# Patient Record
Sex: Female | Born: 1965 | Race: White | Hispanic: No | Marital: Married | Smoking: Never smoker
Health system: Southern US, Community
[De-identification: ages and names within clinical notes are randomized; demographics above are authoritative.]

## PROBLEM LIST (undated history)

## (undated) DIAGNOSIS — IMO0002 Reserved for concepts with insufficient information to code with codable children: Secondary | ICD-10-CM

## (undated) DIAGNOSIS — L309 Dermatitis, unspecified: Secondary | ICD-10-CM

## (undated) DIAGNOSIS — Z87898 Personal history of other specified conditions: Secondary | ICD-10-CM

## (undated) DIAGNOSIS — T8859XA Other complications of anesthesia, initial encounter: Secondary | ICD-10-CM

## (undated) DIAGNOSIS — Z8742 Personal history of other diseases of the female genital tract: Secondary | ICD-10-CM

## (undated) DIAGNOSIS — A630 Anogenital (venereal) warts: Secondary | ICD-10-CM

## (undated) DIAGNOSIS — O09529 Supervision of elderly multigravida, unspecified trimester: Secondary | ICD-10-CM

## (undated) DIAGNOSIS — B373 Candidiasis of vulva and vagina: Secondary | ICD-10-CM

## (undated) DIAGNOSIS — B019 Varicella without complication: Secondary | ICD-10-CM

## (undated) DIAGNOSIS — N939 Abnormal uterine and vaginal bleeding, unspecified: Secondary | ICD-10-CM

## (undated) DIAGNOSIS — R87619 Unspecified abnormal cytological findings in specimens from cervix uteri: Secondary | ICD-10-CM

## (undated) DIAGNOSIS — B379 Candidiasis, unspecified: Secondary | ICD-10-CM

## (undated) DIAGNOSIS — N840 Polyp of corpus uteri: Secondary | ICD-10-CM

## (undated) DIAGNOSIS — T4145XA Adverse effect of unspecified anesthetic, initial encounter: Secondary | ICD-10-CM

## (undated) DIAGNOSIS — N938 Other specified abnormal uterine and vaginal bleeding: Secondary | ICD-10-CM

## (undated) DIAGNOSIS — K469 Unspecified abdominal hernia without obstruction or gangrene: Secondary | ICD-10-CM

## (undated) DIAGNOSIS — C801 Malignant (primary) neoplasm, unspecified: Secondary | ICD-10-CM

## (undated) DIAGNOSIS — Z22322 Carrier or suspected carrier of Methicillin resistant Staphylococcus aureus: Secondary | ICD-10-CM

## (undated) HISTORY — DX: Reserved for concepts with insufficient information to code with codable children: IMO0002

## (undated) HISTORY — DX: Polyp of corpus uteri: N84.0

## (undated) HISTORY — DX: Supervision of elderly multigravida, unspecified trimester: O09.529

## (undated) HISTORY — DX: Personal history of other specified conditions: Z87.898

## (undated) HISTORY — DX: Other complications of anesthesia, initial encounter: T88.59XA

## (undated) HISTORY — DX: Unspecified abnormal cytological findings in specimens from cervix uteri: R87.619

## (undated) HISTORY — DX: Dermatitis, unspecified: L30.9

## (undated) HISTORY — DX: Carrier or suspected carrier of methicillin resistant Staphylococcus aureus: Z22.322

## (undated) HISTORY — DX: Personal history of other diseases of the female genital tract: Z87.42

## (undated) HISTORY — PX: BREAST CYST ASPIRATION: SHX578

## (undated) HISTORY — DX: Other specified abnormal uterine and vaginal bleeding: N93.8

## (undated) HISTORY — DX: Adverse effect of unspecified anesthetic, initial encounter: T41.45XA

## (undated) HISTORY — PX: BREAST BIOPSY: SHX20

## (undated) HISTORY — DX: Malignant (primary) neoplasm, unspecified: C80.1

## (undated) HISTORY — DX: Anogenital (venereal) warts: A63.0

## (undated) HISTORY — DX: Varicella without complication: B01.9

## (undated) HISTORY — DX: Abnormal uterine and vaginal bleeding, unspecified: N93.9

## (undated) HISTORY — DX: Candidiasis, unspecified: B37.9

## (undated) HISTORY — DX: Candidiasis of vulva and vagina: B37.3

## (undated) HISTORY — DX: Unspecified abdominal hernia without obstruction or gangrene: K46.9

## (undated) HISTORY — PX: OTHER SURGICAL HISTORY: SHX169

---

## 1986-11-25 HISTORY — PX: WISDOM TOOTH EXTRACTION: SHX21

## 1994-03-26 HISTORY — PX: BREAST EXCISIONAL BIOPSY: SUR124

## 1994-03-26 HISTORY — PX: BREAST SURGERY: SHX581

## 1998-01-27 ENCOUNTER — Other Ambulatory Visit: Admission: RE | Admit: 1998-01-27 | Discharge: 1998-01-27 | Payer: Self-pay | Admitting: *Deleted

## 1998-03-26 HISTORY — PX: OTHER SURGICAL HISTORY: SHX169

## 1999-02-09 ENCOUNTER — Other Ambulatory Visit: Admission: RE | Admit: 1999-02-09 | Discharge: 1999-02-09 | Payer: Self-pay | Admitting: *Deleted

## 2000-01-16 ENCOUNTER — Other Ambulatory Visit: Admission: RE | Admit: 2000-01-16 | Discharge: 2000-01-16 | Payer: Self-pay | Admitting: Obstetrics and Gynecology

## 2000-05-15 ENCOUNTER — Inpatient Hospital Stay (HOSPITAL_COMMUNITY): Admission: AD | Admit: 2000-05-15 | Discharge: 2000-05-15 | Payer: Self-pay | Admitting: Obstetrics and Gynecology

## 2000-09-08 ENCOUNTER — Inpatient Hospital Stay (HOSPITAL_COMMUNITY): Admission: AD | Admit: 2000-09-08 | Discharge: 2000-09-08 | Payer: Self-pay | Admitting: Obstetrics and Gynecology

## 2000-09-11 ENCOUNTER — Inpatient Hospital Stay (HOSPITAL_COMMUNITY): Admission: AD | Admit: 2000-09-11 | Discharge: 2000-09-15 | Payer: Self-pay | Admitting: Obstetrics and Gynecology

## 2000-09-16 ENCOUNTER — Encounter: Admission: RE | Admit: 2000-09-16 | Discharge: 2000-10-16 | Payer: Self-pay | Admitting: Obstetrics and Gynecology

## 2000-10-21 ENCOUNTER — Other Ambulatory Visit: Admission: RE | Admit: 2000-10-21 | Discharge: 2000-10-21 | Payer: Self-pay | Admitting: Obstetrics and Gynecology

## 2001-03-26 DIAGNOSIS — B373 Candidiasis of vulva and vagina: Secondary | ICD-10-CM

## 2001-03-26 DIAGNOSIS — B3731 Acute candidiasis of vulva and vagina: Secondary | ICD-10-CM

## 2001-03-26 HISTORY — DX: Acute candidiasis of vulva and vagina: B37.31

## 2001-03-26 HISTORY — DX: Candidiasis of vulva and vagina: B37.3

## 2001-10-23 ENCOUNTER — Other Ambulatory Visit: Admission: RE | Admit: 2001-10-23 | Discharge: 2001-10-23 | Payer: Self-pay | Admitting: Obstetrics and Gynecology

## 2002-02-14 ENCOUNTER — Encounter: Payer: Self-pay | Admitting: Obstetrics and Gynecology

## 2002-02-14 ENCOUNTER — Inpatient Hospital Stay (HOSPITAL_COMMUNITY): Admission: AD | Admit: 2002-02-14 | Discharge: 2002-02-14 | Payer: Self-pay | Admitting: Obstetrics and Gynecology

## 2002-03-03 ENCOUNTER — Other Ambulatory Visit: Admission: RE | Admit: 2002-03-03 | Discharge: 2002-03-03 | Payer: Self-pay | Admitting: Obstetrics and Gynecology

## 2002-03-03 ENCOUNTER — Other Ambulatory Visit: Admission: RE | Admit: 2002-03-03 | Discharge: 2002-03-03 | Payer: Self-pay | Admitting: Obstetrics & Gynecology

## 2002-03-26 DIAGNOSIS — Z87898 Personal history of other specified conditions: Secondary | ICD-10-CM

## 2002-03-26 HISTORY — DX: Personal history of other specified conditions: Z87.898

## 2002-05-12 ENCOUNTER — Ambulatory Visit (HOSPITAL_COMMUNITY): Admission: RE | Admit: 2002-05-12 | Discharge: 2002-05-12 | Payer: Self-pay | Admitting: Obstetrics and Gynecology

## 2002-05-12 ENCOUNTER — Encounter: Payer: Self-pay | Admitting: Obstetrics and Gynecology

## 2002-09-11 ENCOUNTER — Encounter: Payer: Self-pay | Admitting: Obstetrics and Gynecology

## 2002-09-11 ENCOUNTER — Ambulatory Visit (HOSPITAL_COMMUNITY): Admission: RE | Admit: 2002-09-11 | Discharge: 2002-09-11 | Payer: Self-pay | Admitting: Obstetrics and Gynecology

## 2002-10-07 ENCOUNTER — Encounter: Payer: Self-pay | Admitting: Obstetrics and Gynecology

## 2002-10-07 ENCOUNTER — Ambulatory Visit (HOSPITAL_COMMUNITY): Admission: RE | Admit: 2002-10-07 | Discharge: 2002-10-07 | Payer: Self-pay | Admitting: Obstetrics and Gynecology

## 2002-10-08 ENCOUNTER — Inpatient Hospital Stay (HOSPITAL_COMMUNITY): Admission: AD | Admit: 2002-10-08 | Discharge: 2002-10-10 | Payer: Self-pay | Admitting: Obstetrics and Gynecology

## 2003-03-03 ENCOUNTER — Other Ambulatory Visit: Admission: RE | Admit: 2003-03-03 | Discharge: 2003-03-03 | Payer: Self-pay | Admitting: Obstetrics and Gynecology

## 2003-03-27 HISTORY — PX: HERNIA REPAIR: SHX51

## 2003-04-16 ENCOUNTER — Ambulatory Visit (HOSPITAL_COMMUNITY): Admission: RE | Admit: 2003-04-16 | Discharge: 2003-04-16 | Payer: Self-pay | Admitting: General Surgery

## 2004-03-06 ENCOUNTER — Other Ambulatory Visit: Admission: RE | Admit: 2004-03-06 | Discharge: 2004-03-06 | Payer: Self-pay | Admitting: Obstetrics and Gynecology

## 2004-12-12 ENCOUNTER — Encounter: Admission: RE | Admit: 2004-12-12 | Discharge: 2004-12-12 | Payer: Self-pay | Admitting: General Surgery

## 2005-04-18 ENCOUNTER — Other Ambulatory Visit: Admission: RE | Admit: 2005-04-18 | Discharge: 2005-04-18 | Payer: Self-pay | Admitting: Obstetrics and Gynecology

## 2007-03-06 ENCOUNTER — Encounter: Admission: RE | Admit: 2007-03-06 | Discharge: 2007-03-06 | Payer: Self-pay | Admitting: Family Medicine

## 2007-03-27 HISTORY — PX: CERVICAL FUSION: SHX112

## 2007-03-28 ENCOUNTER — Ambulatory Visit (HOSPITAL_COMMUNITY): Admission: RE | Admit: 2007-03-28 | Discharge: 2007-03-29 | Payer: Self-pay | Admitting: Neurosurgery

## 2009-03-26 DIAGNOSIS — Z8742 Personal history of other diseases of the female genital tract: Secondary | ICD-10-CM

## 2009-03-26 DIAGNOSIS — N840 Polyp of corpus uteri: Secondary | ICD-10-CM

## 2009-03-26 DIAGNOSIS — N938 Other specified abnormal uterine and vaginal bleeding: Secondary | ICD-10-CM

## 2009-03-26 HISTORY — DX: Polyp of corpus uteri: N84.0

## 2009-03-26 HISTORY — PX: EYE SURGERY: SHX253

## 2009-03-26 HISTORY — DX: Personal history of other diseases of the female genital tract: Z87.42

## 2009-03-26 HISTORY — PX: DILATION AND CURETTAGE OF UTERUS: SHX78

## 2009-03-26 HISTORY — DX: Other specified abnormal uterine and vaginal bleeding: N93.8

## 2009-10-13 ENCOUNTER — Ambulatory Visit (HOSPITAL_COMMUNITY): Admission: RE | Admit: 2009-10-13 | Discharge: 2009-10-13 | Payer: Self-pay | Admitting: Obstetrics and Gynecology

## 2010-06-10 LAB — CBC
RBC: 4.15 MIL/uL (ref 3.87–5.11)
RDW: 12.4 % (ref 11.5–15.5)

## 2010-07-17 ENCOUNTER — Encounter (HOSPITAL_COMMUNITY): Payer: BC Managed Care – PPO

## 2010-07-17 ENCOUNTER — Other Ambulatory Visit: Payer: Self-pay | Admitting: Surgery

## 2010-07-17 LAB — CBC
Platelets: 342 10*3/uL (ref 150–400)
RBC: 4.62 MIL/uL (ref 3.87–5.11)
RDW: 12.3 % (ref 11.5–15.5)
WBC: 6.3 10*3/uL (ref 4.0–10.5)

## 2010-07-17 LAB — SURGICAL PCR SCREEN
MRSA, PCR: NEGATIVE
Staphylococcus aureus: POSITIVE — AB

## 2010-07-17 LAB — HCG, SERUM, QUALITATIVE: Preg, Serum: NEGATIVE

## 2010-07-20 ENCOUNTER — Ambulatory Visit (HOSPITAL_COMMUNITY)
Admission: RE | Admit: 2010-07-20 | Discharge: 2010-07-21 | Disposition: A | Discharge status: Home / Self Care | Payer: BC Managed Care – PPO | Source: Ambulatory Visit | Attending: Surgery | Admitting: Surgery

## 2010-07-20 DIAGNOSIS — Z01812 Encounter for preprocedural laboratory examination: Secondary | ICD-10-CM | POA: Insufficient documentation

## 2010-07-20 DIAGNOSIS — Z79899 Other long term (current) drug therapy: Secondary | ICD-10-CM | POA: Insufficient documentation

## 2010-07-20 DIAGNOSIS — K439 Ventral hernia without obstruction or gangrene: Secondary | ICD-10-CM | POA: Insufficient documentation

## 2010-07-20 DIAGNOSIS — K432 Incisional hernia without obstruction or gangrene: Secondary | ICD-10-CM | POA: Insufficient documentation

## 2010-07-20 DIAGNOSIS — K429 Umbilical hernia without obstruction or gangrene: Secondary | ICD-10-CM | POA: Insufficient documentation

## 2010-07-24 NOTE — Op Note (Signed)
[name], [name]                 ACCOUNT NO.:  [id number]  MEDICAL RECORD NO.:  [id number]           PATIENT TYPE:  O  LOCATION:  1527                         FACILITY:  [facility]  PHYSICIAN:  Ardeth Sportsman, MD     DATE OF BIRTH:  [birth year]  DATE OF PROCEDURE:  07/20/2010 DATE OF DISCHARGE:                              OPERATIVE REPORT   PRIMARY CARE PHYSICIAN:  Pam Drown, M.D.  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  POSTOPERATIVE DIAGNOSES: 1. Recurrent ventral hernia most likely spigelian in right lower     quadrant. 2. Small umbilical hernia through stalk.  PROCEDURE PERFORMED: 1. Laparoscopic ventral hernia repair with mesh. 2. Laparoscopic takedown of bladder with re-tacking. 3. Primary umbilical hernia repair.  ANESTHESIA: 1. General anesthesia. 2. Local anesthetic and field blocks.  SPECIMEN:  None.  DRAIN:  None.  ESTIMATED BLOOD LOSS:  Minimal.  COMPLICATION:  None.  INDICATION:  [name] is a 45 year old female who has had prior abdominal surgeries including C-sections.  She had a spigelian hernia that was repaired in open fashion with onlay mesh in 2005.  She has found some bulging and discomfort there.  Initially, there was just some discomfort,  but no hernia, however, now she has developed one later.  Anatomy and physiology of abdominal formation was discussed. Pathophysiology of herniation with its natural history and risks were discussed.  Options were discussed.  Recommendations made for diagnostic laparoscopic with hernia repair through laparoscopic or open technique. Risks, benefits, and alternatives were discussed.  Questions were answered and she agreed to proceed.  OPERATIVE FINDINGS:  She has a 12 x 7 region ventral hernia most likely 2 larger ones, 1 at the spigelian hernia, and 1 pulling up of some of the stitches.  She  did not have any midline hernias.  DESCRIPTION OF THE PROCEDURE:  Informed consent was confirmed.  The patient voided just prior to coming to the operating room.  She underwent general anesthesia without any difficulty.  She had IV antibiotics given. She was positioned supine with arms tucked.  Her abdomen was prepped and draped in sterile fashion.  Surgical timeout confirmed our plan.  I placed a 5 mm port in the left upper quadrant using optical entry technique.  Entry was clean.  I induced carbon dioxide insufflation. Camera inspection revealed no injury.  Under direct visualization, I placed a 5 mm port in the left flank and a 10 mm port through periumbilical incision just through a small umbilical hernia.  On inspection, noted the fascial defects in the right lower quadrant, they were not incarcerated.  There were no significant adhesions to it.  I decided to take down the peritoneum and bladder in a TAPP-like fashion.  I scored the peritoneum in the lower abdomen using primary blunt as well as some focus cautery, freed the peritoneum off bilateral inguinal regions, and freed the bladder down off the pelvis to allow good mesh overlap.  I meshed the defect.  I chose a 20  x 25 cm dual-side mesh (Physiomesh equals Ultralight polypropylene/Monocryl covering) secured to the abdomen using #1 Prolene interrupted stitches x12.  The upper __________ circumference came through transabdominally.  The lower third of the mesh went down to the pelvis between the bladder and the pelvis to make sure that there is plenty of overlap.  This also covered up the right internal ring and a femoral and obturator foramen as well.  There was no evidence of any hernias down there.  I presumed the round ligament doing this.  There did appear a few transfascial stitches between the pubic brim and the fascia inferiorly so as to make sure I had circumferential transfascial stitches.  I brought the peritoneum  backup and used the laparoscopic absorbable tacker to help re-tack the peritoneum up through the mesh that covered up about half of the mesh up.  I used the tacker until I tacked around the rim as well.  There was small oozer coming off from the left paramedian region, I placed a #1 Prolene transfascial stitch around that including another bite of the mesh and tied that down and provided excellent hemostasis.  I did camera inspection.  There were a few tiny clots, I cleaned out with a Ray-Tec and removed.  Counts were correct.  Gas was evacuated and ports were removed.  The umbilical hernia at the stalk was about 5 mm and I went ahead and closed that using 0 Vicryl interrupted stitches.  Skin was closed __________.  The patient was extubated and sent to recovery room in stable condition. I had discussed postoperative care with the patient in the office. Again prior to surgery, I discussed with her husband as well. Instructions are written.  Hopefully, she will leave later today if she is comfortable, otherwise, she needs to stay there.     Ardeth Sportsman, MD     SCG/MEDQ  D:  07/20/2010  T:  07/21/2010  Job:  [id number]  Electronically Signed by Karie Soda MD on 07/24/2010 01:13:56 PM

## 2010-08-08 NOTE — Op Note (Signed)
[name], [name]                 ACCOUNT NO.:  [id number]   MEDICAL RECORD NO.:  [id number]          PATIENT TYPE:  AMB   LOCATION:  SDS                          FACILITY:  [facility]   PHYSICIAN:  Reinaldo Meeker, M.D. 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   PROCEDURE:  C5-6, C6-7 anterior cervical discectomy with bone bank  fusion followed by anterior cervical plating with a Nuvasive helix  anterior cervical plate.   SURGEON:  Reinaldo Meeker, M.D.   ASSISTANT:  Tia Alert, MD   PROCEDURE IN DETAIL:  After having been placed in the supine position in  5 pounds halter traction, the patient's neck was prepped and draped in  the usual sterile fashion.  Localizing x-ray was taken prior to incision  to identify the appropriate level.  Transverse incision was made in the  right anterior neck starting at the midline and headed towards the  medial aspect of the sternocleidomastoid muscle.  The platysma muscle  was then incised transversely.  The natural fascial plane between the  strap muscles medially and sternocleidomastoid laterally was identified  and followed down to the anterior aspect of cervical spine.  The longus  coli muscles were identified, split in the midline, stripped away  bilaterally with Medical illustrator.  Self-retaining  retractor was placed for exposure and x-ray showed approach to the  appropriate level.   With a 15 blade, the anterior disk at C5-C6 and C6-C7 was incised.  Using pituitary rongeurs and curettes, approximately 90% of the disk  material was removed at both levels.  High-speed drill was used to widen  the interspace at both levels.  The microscope was draped, brought in  the field, used for the end of the case.  Starting at  C5-C6,  microdissection technique was used to remove the remainder of disk  material down the post longitudinal ligament.  The ligament was then  incised transversely and the cut edges removed with a Kerrison punch.  Herniated disk material was then removed as we decompressed and  visualized the C6 nerve root on the left.  Some decompression was  carried out towards the right asymptomatic side.  At this time,  inspection was carried out all in all directions for any evidence of  residual compression and none could be identified.  Large amounts of  irrigation were carried out.  Any bleeding was controlled with Gelfoam.   Attention was then turned to C6-C7.  Once again, the remainder of the  disk material on the posterior longitudinal ligament was removed.  Once  again, the ligament was incised and the cut edge removed.  Once again,  large amounts of herniated disk material were found within the foramen  of C6-C7 on the left.  The C7 nerve root was then decompressed.  At this  time, inspection was carried out at both levels bilaterally  for any  evidence of residual compression.  None could be identified.  Large  amounts of irrigation were carried out.  Measurements were taken and two  6 mm bone bank plugs were reconstituted.  After irrigating once more,  confirming hemostasis, the plugs were impacted without difficulty and  fluoroscopy showed them to be in good position.   A 40 mm helix anterior cervical plate was then chosen.  Under  fluoroscopic guidance, drill holes were placed followed by placing of 13  mm screws times six until the locking mechanism was secured bilaterally  at all three levels.  Final fluoroscopy showed the plate, screws, and  plugs all to be in good position.  Large amounts of irrigation were  carried out.  Any bleeding was controlled with bipolar coagulation.  The  wound was then closed with interrupted Vicryl on the platysma, muscle,  inverted 5-0 PDS in the  subcuticular layer, and Steri-Strips on the  skin.  Sterile dressing and soft collar applied.  The patient was  extubated and taken to the recovery room in stable condition.           ______________________________  Reinaldo Meeker, M.D.     ROK/MEDQ  D:  03/28/2007  T:  03/28/2007  Job:  [id number]

## 2010-08-11 NOTE — Op Note (Signed)
[name], [name]                           ACCOUNT NO.:  [id number]   MEDICAL RECORD NO.:  [id number]                   PATIENT TYPE:  OIB   LOCATION:  NA                                   FACILITY:  [facility]   PHYSICIAN:  Leonie Man, M.D.                DATE OF BIRTH:  03/21/1966   DATE OF PROCEDURE:  04/16/2003  DATE OF DISCHARGE:                                 OPERATIVE REPORT   PREOPERATIVE DIAGNOSIS:  Spigelian hernia right lower quadrant.   POSTOPERATIVE DIAGNOSIS:  Spigelian hernia right lower quadrant.   PROCEDURE:  Repair spigelian hernia with mesh.   SURGEON:  Mardene Celeste. Lurene Shadow, M.D.   ASSISTANT:  Nurse   ANESTHESIA:  General.   NOTE:  The patient is a 45 year old recently postpartum female with a large  bulge at the lateral rectus area in the right lower quadrant.  She has been  having some mild discomfort but no symptoms of bowel obstruction.  She comes  to the operating room now for repair after the risks and potential benefits  of surgery have been discussed, all questions answered, and consent  obtained.   PROCEDURE:  Following the induction of satisfactory general anesthesia with  the patient positioned supine, the abdomen was prepped and draped to be  included in the sterile operative field.  An obliquely placed incision at  the lateral rectus edge over the region of the mass was then carried down  through the skin and subcutaneous tissue and was carried down to the  external oblique aponeurosis which is opened up and carried medially towards  the rectus muscle.  The rectus muscle is retracted medially to find a very  large peritoneal bulge coming through the space.  The peritoneum was grasped  and then entered, a finger was inserted into the peritoneum to palpate the  addition of any other herniae.  There were none  noted.  The peritoneum was  then closed with a purse-string suture somewhat tightening the peritoneum.  I then placed a mesh patch below  the external oblique fascia extending over  the edge of the rectus and over the edge of the internal oblique muscles out  laterally and this was sutured in place with interrupted 2-0 Novafil  sutures.  The external oblique aponeurosis was then closed with a running 2-  0 Novafil suture.  The Scarpa's fascia and subcutaneous tissues were closed  with a running 3-0 Vicryl and the skin was closed with a running 4-0  Monocryl.  Steri-Strips were placed on the wound, the anesthetic reversed,  and the patient removed from the operating room to the recovery room in  stable condition.  She tolerated the procedure well.  Leonie Man, M.D.    PB/MEDQ  D:  04/16/2003  T:  04/16/2003  Job:  [id number]

## 2010-08-11 NOTE — H&P (Signed)
NAMEAMBERLI, [name]                           ACCOUNT NO.:  [id number]   MEDICAL RECORD NO.:  [id number]                   PATIENT TYPE:  OUT   LOCATION:  ULT                                  FACILITY:  [facility]   PHYSICIAN:  Crist Fat. Rivard, M.D.              DATE OF BIRTH:  10/21/1965   DATE OF ADMISSION:  10/07/2002  DATE OF DISCHARGE:                                HISTORY & PHYSICAL   HISTORY OF PRESENT ILLNESS:  This is a 45 year old gravida 3, para 1-0-1-1,  at 39-6/[redacted] weeks gestation who presents for an elective repeat cesarean  section secondary to macrosomia.  Pregnancy has been followed by the nurse  midwife service and remarkable for:  1) AMA.  2) LGA. 3) First trimester  spotting.  4) Previous cesarean section.  5) Desires VBAC.  6) Depression,  anxiety.  7) History of abnormal Pap with laser surgery.   PAST OBSTETRICAL HISTORY:  Remarkable for an elective abortion in 1995 and a  primary low transverse cesarean section in 2002 of a female infant at [redacted]  weeks gestation weighing 9 pounds 12 ounces following and induction of labor  for which the patient developed a fever and failure to progress.   PAST MEDICAL HISTORY:  Remarkable for abnormal Pap smears with laser surgery  in 1991.  History of ovarian cysts.  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  Childhood varicella.  History of anxiety and depression.  Advanced  maternal age.   FAMILY HISTORY:  Remarkable for congestive heart failure in her  grandmothers.  Hypertension in her grandmother and father.  Anemia in her  mother.  Emphysema in her mother.  Hypothyroidism in her cousin.  Breast  cancer in her mother, grandmother, and aunt. Skin cancer in her father.  Anxiety in her mother.  Alcoholism in her grandfather and grandmother.  Genetic history is remarkable for the patient being age 55.   PAST SURGICAL HISTORY:  Remarkable for fibroadenoma removed from the left  breast in 1996.  Laser surgery to her cervix in  1989 and 1992.  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   SOCIAL HISTORY:  The patient is married to Concepcion Elk who is involved and  supportive.  She is of the [employer]. She denies any alcohol, tobacco,  or drug use.   PHYSICAL EXAMINATION:  VITAL SIGNS:  Stable and afebrile.  HEENT:  Within normal limits.  Thyroid normal and not enlarged.  CHEST:  Clear to auscultation.  HEART:  Regular rate and rhythm.  ABDOMEN:  Gravid at 41 cm, vertex to Leopold's.  PELVIC: Cervix is fingertip, 80%, and -1 with a vertex presentation.  Ultrasound today shows polyhydramnios with an AFI of 33.5 and macrosomia  with an estimated fetal weight of 5598 grams.  EXTREMITIES:  Within normal limits.   ASSESSMENT:  1. Intrauterine pregnancy at term.  2. Macrosomia.  3. Polyhydramnios.  4. Previous cesarean section.    PLAN:  1. Admit to operating room.  2. Repeat cesarean section with Dr. Estanislado Pandy.  3. Further orders.     Marie L. Williams, C.N.M.                 Crist Fat Rivard, M.D.    MLW/MEDQ  D:  10/07/2002  T:  10/07/2002  Job:  [id number]

## 2010-08-11 NOTE — Discharge Summary (Signed)
   NAME:  [name], [name]                           ACCOUNT NO.:  [id number]   MEDICAL RECORD NO.:  [id number]                   PATIENT TYPE:  INP   LOCATION:  9122                                 FACILITY:  [facility]   PHYSICIAN:  Crist Fat. Rivard, M.D.              DATE OF BIRTH:  09-Jul-1965   DATE OF ADMISSION:  10/08/2002  DATE OF DISCHARGE:  10/10/2002                                 DISCHARGE SUMMARY   ADMISSION DIAGNOSES:  1. Intrauterine pregnancy at term.  2. Macrosomia.  3. Polyhydramnios.  4. Previous Cesarean section.   DISCHARGE DIAGNOSES:  1. Intrauterine pregnancy at term.  2. Macrosomia.  3. Polyhydramnios.  4. Previous Cesarean section.  5. Postoperative anemia without clinical instability and breast feeding and     plans oral contraceptives for birth control.   PROCEDURE:  Repeat low transverse Cesarean section for delivery of a viable  female infant named [name]. Had Apgar's of 8 and 9 and weighed 11  pounds, 7 ounces on October 08, 2002. Attended and delivered by Dr. Silverio Lay and Wynelle Bourgeois, C.N.M.   HOSPITAL COURSE:  Ms. Mcevers is a 45 year old married white female, gravida  3, para 1, 0/1/1 at term, admitted for repeat Cesarean section secondary to  diagnoses of macrosomia, polyhydramnios and previous Cesarean section. She  underwent the same for delivery of a viable female infant named [name] who weighed 11 pounds, 7 ounces and had Apgar's of 8 and 9 on October 08, 2002. Please see operative note for further details. Her postoperative  course has been uneventful. She is ambulating, voiding, and eating without  difficulty. Her vital signs are stable and she is afebrile. She is breast  feeding without difficulty and desires discharge today.   DISCHARGE INSTRUCTIONS:  Are as per the [facility] and  Gynecologic handout.   DISCHARGE MEDICATIONS:  1. Motrin 600 mg p.o. q.6h p.r.n. for pain.  2. Tylox 1-2 p.o. q.4 to 6 hours  p.r.n. for pain.  3. Prenatal vitamins daily.  4. Ferrous sulfate b.i.d.  5. Colace b.i.d.   DISCHARGE LABORATORY DATA:  Hemoglobin is 8.9, white blood cell count is  7.4, and her platelets are 211,000.    FOLLOW UP:  Discharge followup will be Tuesday or Wednesday at the office  for staple removal and for her 6 week checkup.      Julia Pyo. Horn, C.N.M.              Crist Fat Rivard, M.D.    SJD/MEDQ  D:  10/10/2002  T:  10/11/2002  Job:  [id number]

## 2010-08-11 NOTE — Op Note (Signed)
[facility]  Patient:    [name], [name]                        MRN: [id number] Proc. Date: 09/12/00 Adm. Date:  [id number] Attending:  Silverio Lay A                           Operative Report  PREOPERATIVE DIAGNOSES:       1. Intrauterine pregnancy at 40 weeks 3 days.                               2. Failure to progress.                               3. Suspected macrosomia.  POSTOPERATIVE DIAGNOSES:      1. Intrauterine pregnancy at 40 weeks 3 days.                               2. Failure to progress.                               3. Proven macrosomia.  PROCEDURE:                    Primary low transverse cesarean section.  SURGEON:                      Silverio Lay, M.D.  ASSISTANT:                    Hayes Ludwig, N.P.  ANESTHESIA:                   Epidural.  ESTIMATED BLOOD LOSS:         700 cc.  DESCRIPTION OF PROCEDURE:     After being informed of the planned procedure with possible complications including bleeding, infection, injury to bowels, bladder and ureter, informed consent was obtained. The patient was taken to OR #1 and preexisting epidural was reinforced. A Foley catheter was already in place. The patient was prepped and draped in the sterile fashion. After testing for adequate level of anesthesia, skin, subcutaneous tissue, and fat were incised in a Pfannenstiel fashion. The fascia was incised in a low transverse fashion and linea alba was dissected. The peritoneum was entered in a midline fashion. Visceroperitoneum was entered in the low transverse fashion allowing [place] to develop a bladder flap and to safely retract the bladder. The myometrium was then entered in the low transverse fashion, first with knife and then in a blunt fashion. Amniotic fluid was clear. We assisted the birth of a female infant in ROA presentation at 3:47 p.m. Mouth and nose were suctioned. Cord was clamped with two Kelly clamps and sectioned and  baby was given to the pediatrician present in the room. Twenty cc of blood were drawn from the umbilical vein and the placenta was allowed to spontaneously delivery. Uterine revision was normal. We proceeded with myometrium closure with two layers, first with a running lock suture of 0 Vicryl, then with a Lembert suture of 0 Vicryl covering the first one. Hemostasis was completed in the left angle as well as  midline with three figure-of-eight stitches of 0 Vicryl. Hemostasis was reassessed and adequate. Both tubes and both ovaries were assessed and normal. Both paracolic gutters were cleansed. Hemostasis was reassessed and adequate. Under fascia hemostasis was completed with cautery and fascia was closed with two running sutures of 0 Vicryl and meeting midline. Fat was irrigated with warm saline, wound was infiltrated with 10 cc of Marcaine 0.25%, and skin was closed with staples. Instruments and sponge count is complete x 2. Estimated blood loss 700 cc. Procedure is well tolerated by the patient who is taken to recovery room in a well and stable condition. Baby received an Apgar of 8 at one minute, 9 at five minutes, 9 at 10 minutes and weighed 9 pounds 12 ounces. DD:  09/12/00 TD:  09/12/00 Job: [id number]

## 2010-08-11 NOTE — Discharge Summary (Signed)
[facility]  Patient:    [name], [name]                        MRN: [id number] Adm. Date:  [id number] Disc. Date: [id number] Attending:  Silverio Lay A                           Discharge Summary  REASON FOR ADMISSION:         Intrauterine pregnancy at 40 weeks 2 days, suspected macrosomia, borderline elevation of blood pressure.  HISTORY OF PRESENT ILLNESS:   This is a 45 year old married white female gravida 2, para 0 admitted at 40 weeks and 2 days for elective induction of labor due to suspected macrosomia and borderline elevation of blood pressure.  Her last ultrasound in the office in August 29, 2000 revealed estimated fetal weight at 9 pounds 1 ounce or 97th percentile with normal amniotic fluid index.  At that time elective induction of labor was offered but patient declined.  She was started on Pitocin and at 8 in the morning was 3+ cm, 80% effaced, vertex, -1.  We proceeded with artificial rupture of membranes which revealed clear fluid.  At noon her cervix remained essentially unchanged at 4 cm, 90%, vertex, -1 and an intrauterine pressure catheter was inserted.  At 3 p.m. she was reevaluated with adequate labor for a minimum of three hours with MVU at 260.  Her cervix remained unchanged with now anterior lip edema.  Her temperature slightly elevated to 100.2 despite two doses of penicillin and her blood pressure was 147/97, remaining asymptomatic as far as PIH symptoms.  At that time a long discussion took place regarding labor dystocia with suspected macrosomia and offer was made to follow for a couple more hours and reevaluate but patient declined and requested a cesarean section.  She underwent a primary low transverse cesarean section that day on September 12, 2000 with the birth of a female infant at 3:47 p.m.  Apgars 8, 9, and 9 weighing 9 pounds 12 ounces without complications.  Her postoperative course was uneventful and her blood  pressure rapidly went back to normal.  She received her discharge home on September 15, 2000, or postoperative day #3, in a well and stable condition with a postoperative hemoglobin of 10.1.  She was instructed to come back to the office in four to six weeks for her postoperative postpartum visit.  She was also instructed to call if experiencing increased pain, increased bleeding, or fever and she was given a prescription of Tylox as well as Motrin 600 mg for pain management.  FINAL DIAGNOSES:              Intrauterine pregnancy at 40 weeks and 2 days, macrosomia, failure to progress, status post primary low transverse cesarean section.  CONDITION ON DISCHARGE:       Well and stable. DD:  10/22/00 TD:  10/22/00 Job: [id number]

## 2010-08-11 NOTE — H&P (Signed)
[facility]  Patient:    [name], [name]                          MRN: [id number] Adm. Date:  09/11/00 Attending:  Silverio Lay, M.D.                         History and Physical  REASON FOR ADMISSION:         Intrauterine pregnancy at 40 weeks and two days. Suspected macrosomia.  Borderline elevation of blood pressure.  HISTORY OF PRESENT ILLNESS:   This is a 45 year old married white female, gravida 2, para 0, aborta 1; with a due date of September 09, 2000 by ultrasound. She was last seen in the office on September 11, 2000; reporting good fetal activity, increase in contractions and uterine activity, bloody show and a mucosal discharge; but, denying any watery discharge and denying any symptoms of pregnancy-induced hypertension.  In the office today attempt today was made to insert a Foley catheter in her cervix for cervical ripening, preparing for elective induction of labor.  The cervix is stenotic from the two previous LEAP procedures, and Foley catheter insertion was impossible.  The cervix remained closed, 80% effaced, vertex and minus one.  Her last ultrasound evaluation on August 29, 2000 estimated fetal weight at 9 pounds 1 ounce, 97 percentile; with normal amniotic fluid index and normal Doppler study.  At that time elective induction of labor was offered for suspected macrosomia, but the patient declined.  Prenatal course revealed:  Blood type A positive,  RPR nonreactive, Rubella immune, HBSaG negative, HIV nonreactive, gonorrhea negative, chlamydia negative.  At 11 weeks of pregnancy, reporting urinary frequency and dysuria.  Urine culture showed group B strep.  The patient was treated with amoxicillin 500 mg t.i.d. for seven days.  At 13 weeks of pregnancy an ultrasound revealed a cervical length at 4.5 cm. A 16-week AFP was within normal limits.  A 16-week urine culture testive cure was still positive for group B strep, despite one treatment  with amoxicillin and one treatment with Macrobid.  The patient received another prescription of ampicillin 500 t.i.d. for 10 days.  A 20-week ultrasound revealed normal anatomy survey, anterior placenta, cervical length at 4 cm.  Ultrasound repeated at 25 weeks revealed average for gestational age and cervical length of 3.7 cm.  After four complete courses of antibiotics, the patient was kept on Macrobid 100 mg q.d.  She had a negative urine culture on May 17, 2000.  At 25 weeks urine culture was again positive for group B strep, despite suppressive treatment.  The patient was referred for urology consult.  She was also started on modified bed rest for preterm cervical change.  Consult with Dr. Isabel Caprice revealed a catheterized urine culture negative. Conclusion was cross-contamination from vagina.  He recommended stopping any antibiotics.  A 28-week glucose tolerance test was within normal limits.  A 35-week ultrasound showed estimated fetal weight 95th percentile with normal amniotic fluid.  The prenatal course was otherwise uneventful.  PAST MEDICAL HISTORY:         TAB 1995, no complications.  Two laser procedures on cervix for dysplasia in 1989 and 1992.  Removal of breast lump in 1996, compatible with fibroadenoma.  Positive for HPV.  ALLERGIES:                    SULFA.  FAMILY HISTORY:  Father with chronic hypertension.  Mother, maternal aunt and maternal grandmother, as well as paternal grandmother with breast cancer.  SOCIAL HISTORY:               Married.  Nonsmoker.  Works in Software engineer.  PHYSICAL EXAMINATION:  VITAL SIGNS:                  Current weight 185 pounds, for a total weight gain of 48 pounds.  Blood pressure 124/80.  Urine dipstick showing trace protein.  HEENT:                        Negative.  LUNGS:                        Clear.  HEART:                        Normal.  ABDOMEN:                      Gravid, nontender.   Fundal height at 42 cm. Vertex presentation.  VAGINAL:                      Closed.  80% effaced.  Vertex -1, with strong fibrotic ring, post LEAP.  EXTREMITIES:                  Mild edema.  NST reactive.  ASSESSMENT:                   Intrauterine pregnancy at 40 weeks and two days, status post LEAP with fibrotic cervix.  Suspected macrosomia.  PLAN:                         The patient is admitted to undergo elective induction of labor, with cervical ripening and Pitocin augmentation.  She will receive Cervidil on September 11, 2000.  The patient is well aware of the increased risk of cesarean section due to induction with an unfavorable cervix; but also due to suspected macrosomia.  The patient is also aware we will not recommend an operative delivery.  Consent obtained. DD:  09/11/00 TD:  09/11/00 Job: [id number]

## 2010-08-11 NOTE — Op Note (Signed)
[name], [name]                           ACCOUNT NO.:  [id number]   MEDICAL RECORD NO.:  [id number]                   PATIENT TYPE:  INP   LOCATION:  9199                                 FACILITY:  [facility]   PHYSICIAN:  Crist Fat. Rivard, M.D.              DATE OF BIRTH:  01/06/1966   DATE OF PROCEDURE:  10/08/2002  DATE OF DISCHARGE:                                 OPERATIVE REPORT   PREOPERATIVE DIAGNOSES:  1. Intrauterine pregnancy at 39 weeks 4 days.  2. Macrosomia.  3. Polyhydramnios.  4. Previous cesarean section.   POSTOPERATIVE DIAGNOSES:  1. Intrauterine pregnancy at 39 weeks 4 days.  2. Macrosomia.  3. Polyhydramnios.  4. Previous cesarean section.   ANESTHESIA:  Spinal, Dr. Jean Rosenthal.   PROCEDURE:  Repeat low transverse cesarean section.   SURGEON:  Crist Fat. Rivard, M.D.   ASSISTANT:  Elby Showers. Williams, C.N.M.   ESTIMATED BLOOD LOSS:  500 mL.   PROCEDURE:  After being informed of the planned procedure with possible  complications including bleeding, infection, injury to bowels, bladder,  informed consent was obtained.  The patient was taken to cesarean suite,  given spinal anesthesia without difficulty, and placed in a dorsal decubitus  position, pelvis tilted to the left.  She was prepped and draped in a  sterile fashion.  After assessing for adequate level of anesthesia, some  sensitivity persisted on the left side and local infiltration was performed  using 15 mL of Marcaine 0.25%.  We were then able to proceed with a  Pfannenstiel incision overlooking the previous incision, which was brought  down to the fascia.  Fascia was then incised in a low transverse fashion.  Linea alba was dissected and peritoneum was entered in a midline fashion.  At that point the patient complained of some more sensitivity and the  incision was irrigated with 10 mL of Nesacaine 2%, which provided relief to  the patient.  We were then able to incise the visceral peritoneum in a  low  transverse fashion to safely develop a bladder flap and retract bladder.  Myometrium was then entered in a low transverse fashion, first with a knife,  then bluntly.  Amniotic fluid was clear and abundant, and we collected 2200  mL, confirming the polyhydramnios.  We then assisted the birth of a female  infant in right OA using a vacuum extraction at 9:49 a.m.  Mouth and nose  were suctioned, cord was clamped with two Kelly clamps, and the baby was  given to Dr. Alison Murray, present in the room.   Twenty milliliters of blood was drawn from the umbilical vein, and the  placenta was allowed to deliver spontaneously.  It was complete.  Uterine  revision was negative, and cord had three vessels.  The patient received a  dose of Ancef g at the cord clamping.  We then proceeded with closure of  myometrium  in two layers, first with a running locked suture of 0 Vicryl,  then with a Lembert suture of 0 Vicryl closing the first one.  Hemostasis  was assessed and adequate.  Both paracolic gutters were cleansed, both tubes  and ovaries assessed and normal.  Again at this point the patient complained  of some sensitivity and the incision was irrigated with 20 mL of Nesacaine  1%, which again provided relief to the patient.  Hemostasis was rechecked on  the uterine incision and felt to be adequate.  Under fascia hemostasis was  completed with cautery and the fascia was closed with two running sutures of  0 Vicryl meeting midline.  The wound was then irrigated with warm saline,  hemostasis was completed with cautery, and the skin was closed with staples.  Instrument and sponge count was complete x2.  Estimated blood loss was 500  mL.  The little boy, who was named [name], born at 9:49, received  an Apgar of 8 at one minute and 9 at five minutes and weighed 11 pounds 7  ounces.  The procedure was very well-tolerated by the patient, who was taken  to the recovery room in a well and stable  condition.                                               Crist Fat Rivard, M.D.    SAR/MEDQ  D:  10/08/2002  T:  10/08/2002  Job:  [id number]

## 2010-08-17 ENCOUNTER — Encounter (INDEPENDENT_AMBULATORY_CARE_PROVIDER_SITE_OTHER): Payer: Self-pay | Admitting: Surgery

## 2010-10-09 ENCOUNTER — Ambulatory Visit (INDEPENDENT_AMBULATORY_CARE_PROVIDER_SITE_OTHER): Payer: BC Managed Care – PPO | Admitting: Surgery

## 2010-10-09 ENCOUNTER — Encounter (INDEPENDENT_AMBULATORY_CARE_PROVIDER_SITE_OTHER): Payer: Self-pay | Admitting: Surgery

## 2010-10-09 DIAGNOSIS — K432 Incisional hernia without obstruction or gangrene: Secondary | ICD-10-CM

## 2010-10-09 NOTE — Patient Instructions (Signed)
See "Managing your Pain" Handout

## 2010-10-09 NOTE — Progress Notes (Signed)
Subjective:     Patient ID: [name], female   DOB: 27-May-1965, 45 y.o.   MRN: [id number]  HPI  The. patient comes today with some concerns. She's worried about some suprapubic swelling.  ?Hernia? She notes she still has pain in her right flank/hip.  Off narcotics. Occasionally using Aleve.   No nausea or vomiting. Regular bowel movement. Urinating well. Back to regular activity.  Review of Systems  Constitutional: Negative for fever, chills, diaphoresis, appetite change and fatigue.  HENT: Negative for nosebleeds, sore throat, mouth sores, neck pain and neck stiffness.   Eyes: Negative for photophobia, discharge and visual disturbance.  Respiratory: Negative for cough, choking, chest tightness and shortness of breath.   Cardiovascular: Negative for chest pain and palpitations.  Gastrointestinal: Positive for abdominal pain. Negative for nausea, vomiting, diarrhea, constipation, blood in stool, abdominal distention, anal bleeding and rectal pain.  Genitourinary: Negative for dysuria, frequency, flank pain, vaginal bleeding, vaginal discharge and difficulty urinating.  Musculoskeletal: Negative for back pain, arthralgias and gait problem.  Skin: Negative for color change, pallor and rash.  Neurological: Negative for dizziness, speech difficulty, weakness and numbness.  Hematological: Negative for adenopathy. Does not bruise/bleed easily.  Psychiatric/Behavioral: Negative for confusion and agitation. The patient is not nervous/anxious.        Objective:   Physical Exam  Constitutional: She is oriented to person, place, and time. She appears well-developed and well-nourished. No distress.  HENT:  Head: Normocephalic.  Mouth/Throat: Oropharynx is clear and moist. No oropharyngeal exudate.  Eyes: Conjunctivae and EOM are normal. Pupils are equal, round, and reactive to light. No scleral icterus.  Neck: Normal range of motion. Neck supple. No tracheal deviation present.    Cardiovascular: Normal rate, regular rhythm and intact distal pulses.   Pulmonary/Chest: Effort normal and breath sounds normal. No respiratory distress. She exhibits no tenderness.  Abdominal: Soft. She exhibits no distension and no mass. There is tenderness. There is no rebound and no guarding. Hernia confirmed negative in the right inguinal area and confirmed negative in the left inguinal area.       Small panniculus - no hernias  Genitourinary: No vaginal discharge found.  Musculoskeletal: Normal range of motion. She exhibits no tenderness.  Lymphadenopathy:    She has no cervical adenopathy.       Right: No inguinal adenopathy present.       Left: No inguinal adenopathy present.  Neurological: She is alert and oriented to person, place, and time. No cranial nerve deficit. She exhibits normal muscle tone. Coordination normal.  Skin: Skin is warm and dry. No rash noted. She is not diaphoretic. No erythema.  Psychiatric: She has a normal mood and affect. Her behavior is normal. Judgment and thought content normal.       Assessment:     2-1/2 months status post redo her repair of right lower quadrant/spigelian hernia. No evidence of hernia recurrence.     Plan:     I find no evidence of new hernia. I think she has musculoskeletal strain. I recommended anti-inflammatory (Aleve 2 pilss BID)/ice/heat x3 weeks. Around the clock.  The pain at less than 3 months out his common for this type of redo hernia repair. It should gradually improve with time.  She's working trying to lose weight. Encourage her to continue that. She feels reassured. Return to clinic p.r.n.

## 2010-12-29 LAB — CBC
Hemoglobin: 13.9
MCHC: 34.2
MCV: 91.3
RBC: 4.47
RDW: 12.5
WBC: 7.2

## 2010-12-29 LAB — BASIC METABOLIC PANEL
Creatinine, Ser: 0.42
GFR calc Af Amer: >60
Glucose, Bld: 82

## 2011-07-10 ENCOUNTER — Other Ambulatory Visit: Payer: Self-pay | Admitting: Family Medicine

## 2011-07-10 DIAGNOSIS — M25562 Pain in left knee: Secondary | ICD-10-CM

## 2011-07-13 ENCOUNTER — Ambulatory Visit
Admission: RE | Admit: 2011-07-13 | Discharge: 2011-07-13 | Disposition: A | Discharge status: Home / Self Care | Payer: BC Managed Care – PPO | Source: Ambulatory Visit | Attending: Family Medicine | Admitting: Family Medicine

## 2011-07-13 DIAGNOSIS — M25562 Pain in left knee: Secondary | ICD-10-CM

## 2011-10-01 ENCOUNTER — Ambulatory Visit: Payer: BC Managed Care – PPO | Admitting: Obstetrics and Gynecology

## 2012-02-14 ENCOUNTER — Encounter: Payer: Self-pay | Admitting: Obstetrics and Gynecology

## 2012-02-14 ENCOUNTER — Ambulatory Visit (INDEPENDENT_AMBULATORY_CARE_PROVIDER_SITE_OTHER): Payer: BC Managed Care – PPO | Admitting: Obstetrics and Gynecology

## 2012-02-14 VITALS — BP 110/60 | Ht 64.0 in | Wt 157.0 lb

## 2012-02-14 DIAGNOSIS — Z01419 Encounter for gynecological examination (general) (routine) without abnormal findings: Secondary | ICD-10-CM

## 2012-02-14 DIAGNOSIS — Z124 Encounter for screening for malignant neoplasm of cervix: Secondary | ICD-10-CM

## 2012-02-14 NOTE — Addendum Note (Signed)
Addended by: Darien Ramus on: 02/14/2012 03:40 PM   Modules accepted: Orders

## 2012-02-14 NOTE — Progress Notes (Signed)
The patient reports:no complaints  Contraception:Condoms  Last mammogram:03/14/2011 Normal  Last pap: 10/05/2010 Normal  GC/Chlamydia cultures offered: declined HIV/RPR/HbsAg offered:  declined HSV 1 and 2 glycoprotein offered: declined  Menstrual cycle regular and monthly: Yes Menstrual flow normal: Yes  Urinary symptoms: none Normal bowel movements: Yes Reports abuse at home: No:    Subjective:    [name] is a 46 y.o. female, [id number], who presents for an annual exam.     History   Social History  . Marital Status: Married    Spouse Name: N/A    Number of Children: N/A  . Years of Education: N/A   Social History Main Topics  . Smoking status: Never Smoker   . Smokeless tobacco: Never Used  . Alcohol Use: Yes     Comment: 0-2 GLASSES A WEEK  . Drug Use: No  . Sexually Active: Not Currently    Birth Control/ Protection: Condom, None   Other Topics Concern  . None   Social History Narrative  . None    Menstrual cycle:   LMP: Patient's last menstrual period was 01/25/2012.           Cycle: normal  The following portions of the patient's history were reviewed and updated as appropriate: allergies, current medications, past family history, past medical history, past social history, past surgical history and problem list.  Review of Systems Pertinent items are noted in HPI. Breast:Negative for breast lump,nipple discharge or nipple retraction Gastrointestinal: Negative for abdominal pain, change in bowel habits or rectal bleeding Urinary:negative   Objective:    BP 110/60  Ht 5\' 4"  (1.626 m)  Wt 157 lb (71.215 kg)  BMI 26.95 kg/m2  LMP 01/25/2012    Weight:  Wt Readings from Last 1 Encounters:  02/14/12 157 lb (71.215 kg)          BMI: Body mass index is 26.95 kg/(m^2).  General Appearance: Alert, appropriate appearance for age. No acute distress HEENT: Grossly normal Neck / Thyroid: Supple, no masses, nodes or enlargement Lungs: clear to  auscultation bilaterally Back: No CVA tenderness Breast Exam: No masses or nodes.No dimpling, nipple retraction or discharge. Cardiovascular: Regular rate and rhythm. S1, S2, no murmur Gastrointestinal: Soft, non-tender, no masses or organomegaly Pelvic Exam: Vulva and vagina appear normal. Bimanual exam reveals normal uterus and adnexa. Rectovaginal: normal rectal, no masses Lymphatic Exam: Non-palpable nodes in neck, clavicular, axillary, or inguinal regions Skin: no rash or abnormalities Neurologic: Normal gait and speech, no tremor  Psychiatric: Alert and oriented, appropriate affect.    Assessment:    Normal gyn exam    Plan:    mammogram pap smear return annually or prn STD screening: declined Contraception:no method   Silverio Lay MD

## 2012-02-15 LAB — PAP IG W/ RFLX HPV ASCU

## 2012-03-20 NOTE — Progress Notes (Signed)
Quick Note:  Please send "Dense breast" letter to patient and document in chart when letter is sent. ______ 

## 2012-03-24 ENCOUNTER — Encounter: Payer: Self-pay | Admitting: Obstetrics and Gynecology

## 2013-05-06 ENCOUNTER — Ambulatory Visit (INDEPENDENT_AMBULATORY_CARE_PROVIDER_SITE_OTHER): Payer: BC Managed Care – PPO | Admitting: Internal Medicine

## 2013-05-06 ENCOUNTER — Ambulatory Visit (HOSPITAL_COMMUNITY)
Admission: RE | Admit: 2013-05-06 | Discharge: 2013-05-06 | Disposition: A | Discharge status: Home / Self Care | Payer: BC Managed Care – PPO | Source: Ambulatory Visit | Attending: Cardiovascular Disease | Admitting: Cardiovascular Disease

## 2013-05-06 ENCOUNTER — Encounter: Payer: Self-pay | Admitting: Internal Medicine

## 2013-05-06 VITALS — BP 120/80 | HR 86 | Ht 64.0 in | Wt 156.0 lb

## 2013-05-06 DIAGNOSIS — R079 Chest pain, unspecified: Secondary | ICD-10-CM

## 2013-05-06 DIAGNOSIS — R002 Palpitations: Secondary | ICD-10-CM

## 2013-05-06 DIAGNOSIS — R072 Precordial pain: Secondary | ICD-10-CM

## 2013-05-06 DIAGNOSIS — I079 Rheumatic tricuspid valve disease, unspecified: Secondary | ICD-10-CM | POA: Insufficient documentation

## 2013-05-06 DIAGNOSIS — R011 Cardiac murmur, unspecified: Secondary | ICD-10-CM

## 2013-05-06 NOTE — Progress Notes (Signed)
2D Echo Performed 05/06/2013    Destynee Stringfellow, RCS  

## 2013-05-06 NOTE — Progress Notes (Signed)
OFFICE NOTE  Chief Complaint:  Chest pain, palpitations, murmur  Primary Care Physician: Cari Caraway, MD  HPI:  [name] is a pleasant 48 year old female who is coming for to me by Dr. Cletis Media, her OB/GYN. Recently she was in the office and on exam was noted to have a murmur. It seems after some discussion Ms. Frohlich has been having some problems with chest discomfort, palpitations and an episode of shortness of breath and fatigue. She says that over the past 6-9 months she's been having these symptoms including palpitations that occur sometimes with exertion or other times at rest. With regard to the murmur, she was told that she did have a murmur as a child when she was being evaluated for scarlet fever. Symptoms had improved and her murmur apparently disappeared for some time but was recently heard again. 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 She is a avid runner, about 3 times a week and participates in 5K events. Recently she participated in a 5K and felt markedly fatigued and short of breath toward the end of the run. She says that she thought she might have had a cold prior to that, which could explain some of her symptoms.  However she thinks her palpitations are getting somewhat worse.  Her chest pain described as a heaviness or soreness, sometimes associated with palpitations, lightheadedness or dizziness.  Improve sometimes with rest but at other times randomly.  PMHx:  Past Medical History  Diagnosis Date  . 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 MRSA (methicillin resistant staph aureus) culture positive   . H/O metrorrhagia 10/14/2009  . Endometrial polyp 2011  . Abnormal Pap smear [facility]  . 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 Candidiasis   . AMA (advanced maternal age) multigravida 55+   . Macrosomia   . Cancer   . Varicella   . Complication of anesthesia     Shakes  & get cold according to ptient  . Monilial vaginitis 2003  . H/O mastitis 2004  . Vaginal bleeding, abnormal   . Dermatitis   . H/O metrorrhagia 2011  . DUB (dysfunctional uterine bleeding) 2011    Past Surgical History  Procedure Laterality Date  . Cesarean section    . Hernia repair  2005  . Cervical fusion  2009  . Dilation and curettage of uterus  2011  . Eye surgery  2011    CATARACTS  . Basal cancer cell removal  2000    Face   . Wisdom tooth extraction  11/1986  . Laser surgery on cervical dysplasia  6/89 & 9/92  . Breast surgery  1996    FAMHx:  Family History  Problem Relation Age of Onset  . COPD Mother     Emphysema  . Breast cancer Mother   . Depression Mother   . Alcohol abuse Mother   . Hypertension Father   . Kidney disease Father   . Skin cancer Father   . Hypertension Maternal Grandmother   . Breast cancer Maternal Grandmother   . Alcohol abuse Maternal Grandfather   . Heart disease Paternal Grandmother     Congestive Heart Failure  . Hypertension Paternal Grandmother   . Breast cancer Paternal Grandmother     SOCHx:   reports that she has never smoked. She has never used smokeless tobacco. She reports that she drinks about  1.5 ounces of alcohol per week. She reports that she does not use illicit drugs.  ALLERGIES:  Allergies  Allergen Reactions  . Sulfa Drugs Cross Reactors Nausea Only and Rash    ROS: A comprehensive review of systems was negative except for: Respiratory: positive for dyspnea on exertion Cardiovascular: positive for chest pressure/discomfort, palpitations and murmur  HOME MEDS: Current Outpatient Prescriptions  Medication Sig Dispense Refill  . Multiple Vitamin (MULTIVITAMIN) capsule Take 1 capsule by mouth daily.         No current facility-administered medications for this visit.    LABS/IMAGING: No results found for this or any previous visit (from the past 48 hour(s)). No results found.  VITALS: BP 120/80   Pulse 86  Ht 5\' 4"  (1.626 m)  Wt 156 lb (70.761 kg)  BMI 26.76 kg/m2  EXAM: General appearance: alert and no distress Neck: no carotid bruit and no JVD Lungs: clear to auscultation bilaterally Heart: irregularly irregular rhythm, S1, S2 normal and systolic murmur: early systolic 2/6, blowing at apex Abdomen: soft, non-tender; bowel sounds normal; no masses,  no organomegaly Extremities: extremities normal, atraumatic, no cyanosis or edema Pulses: 2+ and symmetric Skin: Skin color, texture, turgor normal. No rashes or lesions Neurologic: Grossly normal Psych: Mood, affect normal  EKG: Sinus rhythm with occasional PVCs at 86  ASSESSMENT: 1. Palpitations-PVCs noted 2. Murmur-suggestive of mitral regurgitation 3. Chest pressure/dyspnea with exertion  PLAN: 1.   Ms. Acord is having some symptoms of shortness of breath and pressure with exertion, but does not happen routinely with exercise. She is palpitations and there is evidence of PVCs on her EKG today. She was not really aware of them, but does feel like they come on fairly frequently, maybe several times a week and last much longer and cause her more symptoms. Therefore would recommend a one week monitor to evaluate for these applications and the burden of her PVCs. I would also recommend an exercise treadmill stress test. Although she has few cardiac risk factors, she is having new PVCs and has had chest pressure, decreased exercise tolerance and some shortness of breath which could be related to ischemia. Finally I would recommend an echocardiogram to evaluate her murmur, which is soft but could represent mitral regurgitation. I do not appreciate a midsystolic click and suspect mitral valve prolapse is less likely.  Thank you again for the kind referral. I plan to see her back to discuss results of her studies in a few weeks.  Pixie Casino, MD, [facility] Attending Cardiologist [facility]  Marlette Curvin C 05/06/2013, 5:52  PM

## 2013-05-06 NOTE — Patient Instructions (Signed)
Your physician has requested that you have an echocardiogram. Echocardiography is a painless test that uses sound waves to create images of your heart. It provides your doctor with information about the size and shape of your heart and how well your heart's chambers and valves are working. This procedure takes approximately one hour. There are no restrictions for this procedure.  ECHO TODAY.  Your physician has requested that you have an exercise tolerance test. For further information please visit [email or web address].[email or web address]. Please also follow instruction sheet, as given.  Your physician has recommended that you wear an event monitor. Event monitors are medical devices that record the heart's electrical activity. Doctors most often [place] these monitors to diagnose arrhythmias. Arrhythmias are problems with the speed or rhythm of the heartbeat. The monitor is a small, portable device. You can wear one while you do your normal daily activities. This is usually used to diagnose what is causing palpitations/syncope (passing out). You will wear this for 1 week.   Your physician recommends that you schedule a follow-up appointment after your stress test & monitor.

## 2013-05-11 ENCOUNTER — Telehealth (HOSPITAL_COMMUNITY): Payer: Self-pay | Admitting: *Deleted

## 2013-05-13 ENCOUNTER — Telehealth: Payer: Self-pay | Admitting: Internal Medicine

## 2013-05-13 ENCOUNTER — Telehealth: Payer: Self-pay | Admitting: *Deleted

## 2013-05-13 NOTE — Telephone Encounter (Signed)
Returning your call. °

## 2013-05-13 NOTE — Telephone Encounter (Signed)
Patient call returned. Notified her of results.

## 2013-05-26 ENCOUNTER — Ambulatory Visit (HOSPITAL_COMMUNITY)
Admission: RE | Admit: 2013-05-26 | Discharge: 2013-05-26 | Disposition: A | Discharge status: Home / Self Care | Payer: BC Managed Care – PPO | Source: Ambulatory Visit | Attending: Internal Medicine | Admitting: Internal Medicine

## 2013-05-26 DIAGNOSIS — R079 Chest pain, unspecified: Secondary | ICD-10-CM | POA: Insufficient documentation

## 2013-05-26 DIAGNOSIS — R002 Palpitations: Secondary | ICD-10-CM

## 2013-05-28 ENCOUNTER — Telehealth: Payer: Self-pay | Admitting: *Deleted

## 2013-05-28 NOTE — Telephone Encounter (Signed)
Left message on private VM with normal exercise tolerance test results - reminded of OV 3/20

## 2013-06-12 ENCOUNTER — Ambulatory Visit (INDEPENDENT_AMBULATORY_CARE_PROVIDER_SITE_OTHER): Payer: BC Managed Care – PPO | Admitting: Internal Medicine

## 2013-06-12 ENCOUNTER — Encounter: Payer: Self-pay | Admitting: Internal Medicine

## 2013-06-12 VITALS — BP 128/82 | HR 88 | Ht 64.0 in | Wt 160.7 lb

## 2013-06-12 DIAGNOSIS — R002 Palpitations: Secondary | ICD-10-CM

## 2013-06-12 DIAGNOSIS — I493 Ventricular premature depolarization: Secondary | ICD-10-CM

## 2013-06-12 DIAGNOSIS — R079 Chest pain, unspecified: Secondary | ICD-10-CM

## 2013-06-12 DIAGNOSIS — I4949 Other premature depolarization: Secondary | ICD-10-CM

## 2013-06-12 NOTE — Patient Instructions (Signed)
Your physician recommends that you schedule a follow-up appointment as needed  

## 2013-06-12 NOTE — Progress Notes (Signed)
OFFICE NOTE  Chief Complaint:  Chest pain, palpitations, murmur  Primary Care Physician: Cari Caraway, MD  HPI:  [name] is a pleasant 48 year old female who is coming for to me by Dr. Cletis Media, her OB/GYN. Recently she was in the office and on exam was noted to have a murmur. It seems after some discussion Ms. Horn has been having some problems with chest discomfort, palpitations and an episode of shortness of breath and fatigue. She says that over the past 6-9 months she's been having these symptoms including palpitations that occur sometimes with exertion or other times at rest. With regard to the murmur, she was told that she did have a murmur as a child when she was being evaluated for scarlet fever. Symptoms had improved and her murmur apparently disappeared for some time but was recently heard again. 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 She is a avid runner, about 3 times a week and participates in 5K events. Recently she participated in a 5K and felt markedly fatigued and short of breath toward the end of the run. She says that she thought she might have had a cold prior to that, which could explain some of her symptoms.  However she thinks her palpitations are getting somewhat worse.  Her chest pain described as a heaviness or soreness, sometimes associated with palpitations, lightheadedness or dizziness.  Improve sometimes with rest but at other times randomly.  [name] returns today for followup of her tests. She underwent an echocardiogram which showed essentially normal structural heart. There were no valvular abnormalities and no significant regurgitation. She had normal systolic and diastolic function.  She also underwent an exercise treadmill stress test and exercised for 11 minutes, 58.5 metabolic equivalents. This was negative for ischemia.  Finally, her monitor demonstrated isolated PVCs but no evidence  for nonsustained VT, atrial fibrillation or other arrhythmias.  PMHx:  Past Medical History  Diagnosis Date  . 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 MRSA (methicillin resistant staph aureus) culture positive   . H/O metrorrhagia 10/14/2009  . Endometrial polyp 2011  . Abnormal Pap smear [place]  . 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 Candidiasis   . AMA (advanced maternal age) multigravida 60+   . Macrosomia   . Cancer   . Varicella   . Complication of anesthesia     Shakes & get cold according to ptient  . Monilial vaginitis 2003  . H/O mastitis 2004  . Vaginal bleeding, abnormal   . Dermatitis   . H/O metrorrhagia 2011  . DUB (dysfunctional uterine bleeding) 2011    Past Surgical History  Procedure Laterality Date  . Cesarean section    . Hernia repair  2005  . Cervical fusion  2009  . Dilation and curettage of uterus  2011  . Eye surgery  2011    CATARACTS  . Basal cancer cell removal  2000    Face   . Wisdom tooth extraction  11/1986  . Laser surgery on cervical dysplasia  6/89 & 9/92  . Breast surgery  1996    FAMHx:  Family History  Problem Relation Age of Onset  . COPD Mother     Emphysema  . Breast cancer Mother   . Depression Mother   . Alcohol abuse Mother   . Hypertension Father   . Kidney disease Father   .  Skin cancer Father   . Hypertension Maternal Grandmother   . Breast cancer Maternal Grandmother   . Alcohol abuse Maternal Grandfather   . Heart disease Paternal Grandmother     Congestive Heart Failure  . Hypertension Paternal Grandmother   . Breast cancer Paternal Grandmother     SOCHx:   reports that she has never smoked. She has never used smokeless tobacco. She reports that she drinks about 1.5 ounces of alcohol per week. She reports that she does not use illicit drugs.  ALLERGIES:  Allergies  Allergen Reactions  . Sulfa Drugs Cross Reactors Nausea Only and Rash    ROS: A comprehensive review  of systems was negative except for: Respiratory: positive for dyspnea on exertion Cardiovascular: positive for chest pressure/discomfort, palpitations and murmur  HOME MEDS: Current Outpatient Prescriptions  Medication Sig Dispense Refill  . Multiple Vitamin (MULTIVITAMIN) capsule Take 1 capsule by mouth daily.         No current facility-administered medications for this visit.    LABS/IMAGING: No results found for this or any previous visit (from the past 48 hour(s)). No results found.  VITALS: BP 128/82  Pulse 88  Ht 5\' 4"  (1.626 m)  Wt 160 lb 11.2 oz (72.893 kg)  BMI 27.57 kg/m2  EXAM: General appearance: alert and no distress Neck: no carotid bruit and no JVD Lungs: clear to auscultation bilaterally Heart: irregularly irregular rhythm, S1, S2 normal and systolic murmur: early systolic 2/6, blowing at apex Abdomen: soft, non-tender; bowel sounds normal; no masses,  no organomegaly Extremities: extremities normal, atraumatic, no cyanosis or edema Pulses: 2+ and symmetric Skin: Skin color, texture, turgor normal. No rashes or lesions Neurologic: Grossly normal Psych: Mood, affect normal  EKG: Sinus rhythm with occasional PVCs at 86  ASSESSMENT: 1. Palpitations-PVCs noted 2. Murmur-no significant valvular disease 3. Chest pressure/dyspnea with exertion - negative Bruce treadmill stress test  PLAN: 1.   Ms. Horn had a negative stress test and a normal echo with normal diastolic function. No valvular abnormalities were noted. She is having PVCs, which I suspect could be isolated or perhaps RV outflow tract PVCs. They may be suppressed with a beta blocker, but she is not interested in medication at this time. She will continue to work on stress reduction, lifestyle modification and avoidance of stimulants. I'm happy to see her back on an as-needed basis.  Thank you again for the kind referral.  Pixie Casino, MD, [facility] Attending Cardiologist [facility]  Koji Niehoff C 06/12/2013, 5:00 PM

## 2013-07-27 NOTE — Telephone Encounter (Signed)
Encounter Closed---07/27/13 TP 

## 2014-01-25 ENCOUNTER — Encounter: Payer: Self-pay | Admitting: Internal Medicine

## 2014-06-04 ENCOUNTER — Other Ambulatory Visit: Payer: Self-pay | Admitting: Radiology

## 2014-06-04 DIAGNOSIS — R922 Inconclusive mammogram: Secondary | ICD-10-CM

## 2014-06-04 DIAGNOSIS — Z803 Family history of malignant neoplasm of breast: Secondary | ICD-10-CM

## 2014-06-29 ENCOUNTER — Ambulatory Visit
Admission: RE | Admit: 2014-06-29 | Discharge: 2014-06-29 | Disposition: A | Discharge status: Home / Self Care | Payer: BLUE CROSS/BLUE SHIELD | Source: Ambulatory Visit | Attending: Radiology | Admitting: Radiology

## 2014-06-29 DIAGNOSIS — Z803 Family history of malignant neoplasm of breast: Secondary | ICD-10-CM

## 2014-06-29 DIAGNOSIS — R9230 Dense breasts, unspecified: Secondary | ICD-10-CM

## 2014-06-29 DIAGNOSIS — R922 Inconclusive mammogram: Secondary | ICD-10-CM

## 2014-06-29 MED ORDER — GADOBENATE DIMEGLUMINE 529 MG/ML IV SOLN
14.0000 mL | Freq: Once | INTRAVENOUS | Status: AC | PRN
  Administered 2014-06-29: 14 mL via INTRAVENOUS

## 2014-07-05 ENCOUNTER — Other Ambulatory Visit: Payer: Self-pay | Admitting: Radiology

## 2014-07-05 DIAGNOSIS — R928 Other abnormal and inconclusive findings on diagnostic imaging of breast: Secondary | ICD-10-CM

## 2014-07-13 ENCOUNTER — Ambulatory Visit
Admission: RE | Admit: 2014-07-13 | Discharge: 2014-07-13 | Disposition: A | Discharge status: Home / Self Care | Payer: BLUE CROSS/BLUE SHIELD | Source: Ambulatory Visit | Attending: Radiology | Admitting: Radiology

## 2014-07-13 DIAGNOSIS — R928 Other abnormal and inconclusive findings on diagnostic imaging of breast: Secondary | ICD-10-CM

## 2014-07-13 MED ORDER — GADOBENATE DIMEGLUMINE 529 MG/ML IV SOLN: 15.0000 mL | Freq: Once | INTRAVENOUS | Status: AC | PRN

## 2014-12-10 ENCOUNTER — Other Ambulatory Visit: Payer: Self-pay | Admitting: Obstetrics and Gynecology

## 2014-12-10 DIAGNOSIS — R922 Inconclusive mammogram: Secondary | ICD-10-CM

## 2014-12-10 DIAGNOSIS — Z803 Family history of malignant neoplasm of breast: Secondary | ICD-10-CM

## 2014-12-23 ENCOUNTER — Other Ambulatory Visit: Payer: BLUE CROSS/BLUE SHIELD

## 2015-01-04 ENCOUNTER — Other Ambulatory Visit: Payer: Self-pay | Admitting: Obstetrics and Gynecology

## 2015-01-04 DIAGNOSIS — Z803 Family history of malignant neoplasm of breast: Secondary | ICD-10-CM

## 2015-01-04 DIAGNOSIS — N6489 Other specified disorders of breast: Secondary | ICD-10-CM

## 2015-01-04 DIAGNOSIS — R922 Inconclusive mammogram: Secondary | ICD-10-CM

## 2015-01-04 DIAGNOSIS — N6019 Diffuse cystic mastopathy of unspecified breast: Secondary | ICD-10-CM

## 2015-01-04 DIAGNOSIS — N6099 Unspecified benign mammary dysplasia of unspecified breast: Secondary | ICD-10-CM

## 2015-01-05 ENCOUNTER — Inpatient Hospital Stay: Admission: RE | Admit: 2015-01-05 | Payer: BLUE CROSS/BLUE SHIELD | Source: Ambulatory Visit

## 2015-05-10 ENCOUNTER — Other Ambulatory Visit: Payer: Self-pay

## 2015-05-10 DIAGNOSIS — Z1231 Encounter for screening mammogram for malignant neoplasm of breast: Secondary | ICD-10-CM

## 2015-06-01 ENCOUNTER — Ambulatory Visit: Payer: BLUE CROSS/BLUE SHIELD

## 2015-06-17 ENCOUNTER — Ambulatory Visit
Admission: RE | Admit: 2015-06-17 | Discharge: 2015-06-17 | Disposition: A | Discharge status: Home / Self Care | Payer: BC Managed Care – PPO | Source: Ambulatory Visit

## 2015-06-17 DIAGNOSIS — Z1231 Encounter for screening mammogram for malignant neoplasm of breast: Secondary | ICD-10-CM

## 2015-06-28 ENCOUNTER — Other Ambulatory Visit: Payer: Self-pay | Admitting: Obstetrics and Gynecology

## 2015-06-28 DIAGNOSIS — R928 Other abnormal and inconclusive findings on diagnostic imaging of breast: Secondary | ICD-10-CM

## 2015-07-11 ENCOUNTER — Ambulatory Visit
Admission: RE | Admit: 2015-07-11 | Discharge: 2015-07-11 | Disposition: A | Discharge status: Home / Self Care | Payer: BC Managed Care – PPO | Source: Ambulatory Visit | Attending: Obstetrics and Gynecology | Admitting: Obstetrics and Gynecology

## 2015-07-11 ENCOUNTER — Other Ambulatory Visit: Payer: Self-pay | Admitting: Obstetrics and Gynecology

## 2015-07-11 DIAGNOSIS — R928 Other abnormal and inconclusive findings on diagnostic imaging of breast: Secondary | ICD-10-CM

## 2015-07-19 ENCOUNTER — Ambulatory Visit
Admission: RE | Admit: 2015-07-19 | Discharge: 2015-07-19 | Disposition: A | Discharge status: Home / Self Care | Payer: BC Managed Care – PPO | Source: Ambulatory Visit | Attending: Obstetrics and Gynecology | Admitting: Obstetrics and Gynecology

## 2015-07-19 ENCOUNTER — Other Ambulatory Visit: Payer: Self-pay | Admitting: Obstetrics and Gynecology

## 2015-07-19 DIAGNOSIS — R928 Other abnormal and inconclusive findings on diagnostic imaging of breast: Secondary | ICD-10-CM

## 2016-03-16 ENCOUNTER — Other Ambulatory Visit: Payer: Self-pay | Admitting: Obstetrics and Gynecology

## 2016-03-16 DIAGNOSIS — N6320 Unspecified lump in the left breast, unspecified quadrant: Secondary | ICD-10-CM

## 2016-03-21 ENCOUNTER — Ambulatory Visit
Admission: RE | Admit: 2016-03-21 | Discharge: 2016-03-21 | Disposition: A | Discharge status: Home / Self Care | Payer: BC Managed Care – PPO | Source: Ambulatory Visit | Attending: Obstetrics and Gynecology | Admitting: Obstetrics and Gynecology

## 2016-03-21 ENCOUNTER — Other Ambulatory Visit: Payer: Self-pay | Admitting: Obstetrics and Gynecology

## 2016-03-21 DIAGNOSIS — N6320 Unspecified lump in the left breast, unspecified quadrant: Secondary | ICD-10-CM

## 2016-05-08 ENCOUNTER — Other Ambulatory Visit: Payer: Self-pay | Admitting: Obstetrics and Gynecology

## 2016-05-08 DIAGNOSIS — Z803 Family history of malignant neoplasm of breast: Secondary | ICD-10-CM

## 2016-08-09 ENCOUNTER — Ambulatory Visit
Admission: RE | Admit: 2016-08-09 | Discharge: 2016-08-09 | Disposition: A | Discharge status: Home / Self Care | Payer: BC Managed Care – PPO | Source: Ambulatory Visit | Attending: Obstetrics and Gynecology | Admitting: Obstetrics and Gynecology

## 2016-08-09 DIAGNOSIS — Z803 Family history of malignant neoplasm of breast: Secondary | ICD-10-CM

## 2016-08-09 MED ORDER — GADOBENATE DIMEGLUMINE 529 MG/ML IV SOLN
14.0000 mL | Freq: Once | INTRAVENOUS | Status: AC | PRN
  Administered 2016-08-09: 14 mL via INTRAVENOUS

## 2017-04-16 IMAGING — US US BREAST*R* LIMITED INC AXILLA
1 series · 2 of 2 positions shown · non-contrast
Comparison: 06/17/2015 and earlier priors.

CLINICAL DATA: 49-year-old patient recalled from recent screening
3D mammogram for possible masses in the left breast and possible
distortion in the right breast. The patient has a strong family
history of breast cancer, including her sister at age 38, mother at
age 52, maternal aunt at age 48, and maternal grandmother at age 62.
The patient underwent a breast MRI in June 2014 and a subsequent
MRI guided right breast biopsy, with benign findings. A six-month
follow-up breast MRI was recommended, but the patient has been
unable to obtain a follow-up breast MRI due to lack of approval from
her medical insurance.

EXAM:
2D DIGITAL DIAGNOSTIC BILATERAL MAMMOGRAM without CAD AND ADJUNCT
TOMO
ULTRASOUND BILATERAL BREAST

[Series 1: us breast*right* limited inc axilla · 0.07mm/px · 2 of 2 slices shown]
[im 1/2]
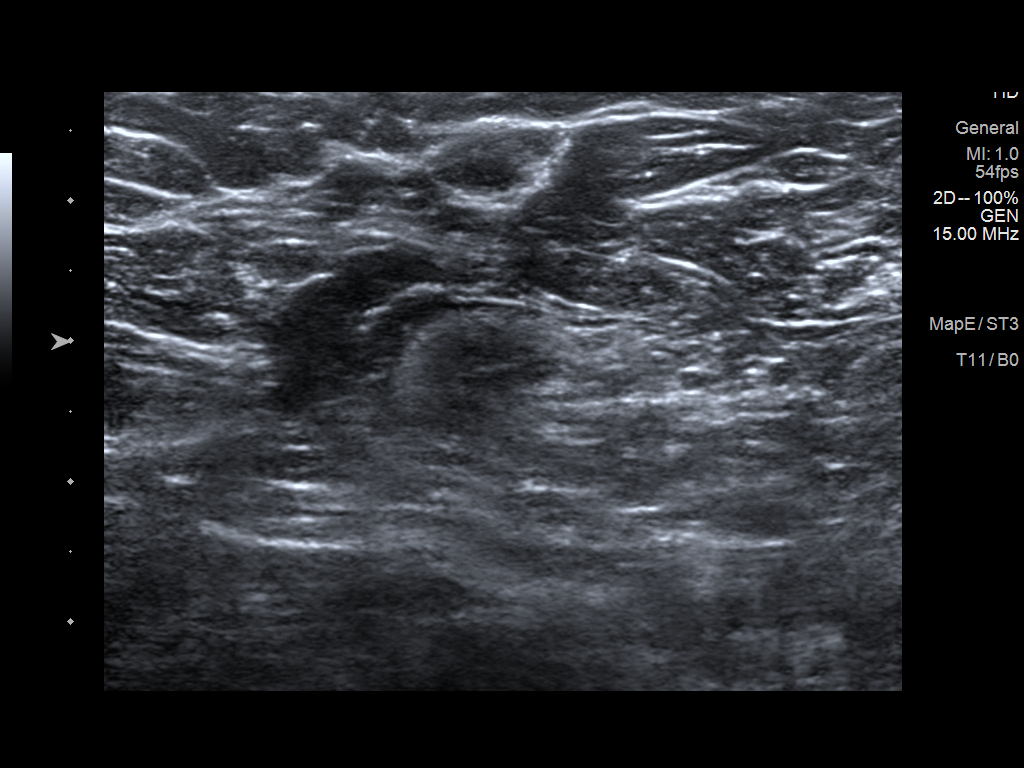
[im 2/2]
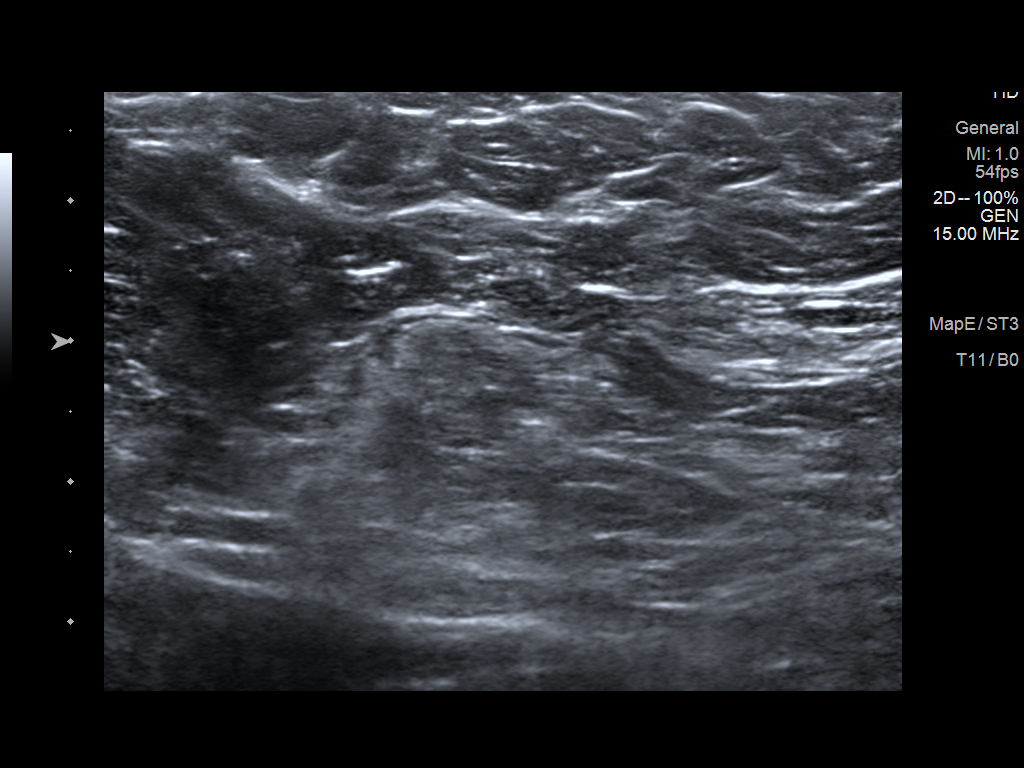

[2 of 2 positions shown; findings below may reference images not displayed]

Breast MRI from June 2014.

ACR Breast Density Category c: The breast tissue is heterogeneously
dense, which may obscure small masses.
FINDINGS: Spot compression tomographic views of the posterior third of the
upper central right breast show persistent architectural distortion,
best seen in the CC projection on slice 28. This area of distortion
measures approximately 3.4 cm greatest diameter. Probable subtle
distortion is seen posteriorly in superior right breast on the MLO
spot compression views. There is no associated mass.

Spot compression tomographic views of the upper outer and upper
central left breast confirm 2 circumscribed oval masses.

Mammographic images were processed with CAD.

On physical exam, I do palpate an approximately lump at 2 o'clock
position left breast 10 cm from the nipple. No additional mass is
palpated in the superior left breast. I do not palpate a mass in the
upper central right breast.

Targeted ultrasound is performed, showing a 2.5 x 2.1 x 1.3 cm
simple cyst at 2 o'clock position 10 cm from the nipple. This cyst
is palpable. At 1 o'clock position 6 cm from nipple is a 1.3 x 0.9 x
1.3 cm cyst. These account for the masses seen at mammography. No
suspicious mass is seen in the superior left breast on ultrasound.

Ultrasound of the superior right breast shows complex
heterogeneously dense breast parenchyma without a discrete area of
distortion. No solid or suspicious mass is identified in the
superior right breast.
IMPRESSION: 1. Subtle area of architectural distortion in the posterior third of
the upper central right breast, best seen in the CC projection.
Complex sclerosing lesion or malignancy cannot be excluded.
2. 2 benign cysts in left breast.  No evidence of malignancy.

RECOMMENDATION:
Stereotactic biopsy of the subtle right breast architectural
distortion is recommended and has been scheduled for July 19, 2015.

It was recommended that the patient have a six-month follow-up
bilateral breast MRI, following her breast MRI guided biopsy in
Wednesday June, 2014. This remains a recommendation, especially given the
patient's significant family history of breast cancer and this was
discussed with the patient today.

I have discussed the findings and recommendations with the patient.
Results were also provided in writing at the conclusion of the
visit. If applicable, a reminder letter will be sent to the patient
regarding the next appointment.

BI-RADS CATEGORY  4: Suspicious.

## 2017-04-16 IMAGING — MG MM DIAG BREAST TOMO BILATERAL
6 of 10 series · 6 of 30 positions shown · non-contrast
Comparison: 06/17/2015 and earlier priors.

CLINICAL DATA: 49-year-old patient recalled from recent screening
3D mammogram for possible masses in the left breast and possible
distortion in the right breast. The patient has a strong family
history of breast cancer, including her sister at age 38, mother at
age 52, maternal aunt at age 48, and maternal grandmother at age 62.
The patient underwent a breast MRI in June 2014 and a subsequent
MRI guided right breast biopsy, with benign findings. A six-month
follow-up breast MRI was recommended, but the patient has been
unable to obtain a follow-up breast MRI due to lack of approval from
her medical insurance.

EXAM:
2D DIGITAL DIAGNOSTIC BILATERAL MAMMOGRAM without CAD AND ADJUNCT
TOMO
ULTRASOUND BILATERAL BREAST

[R CC]
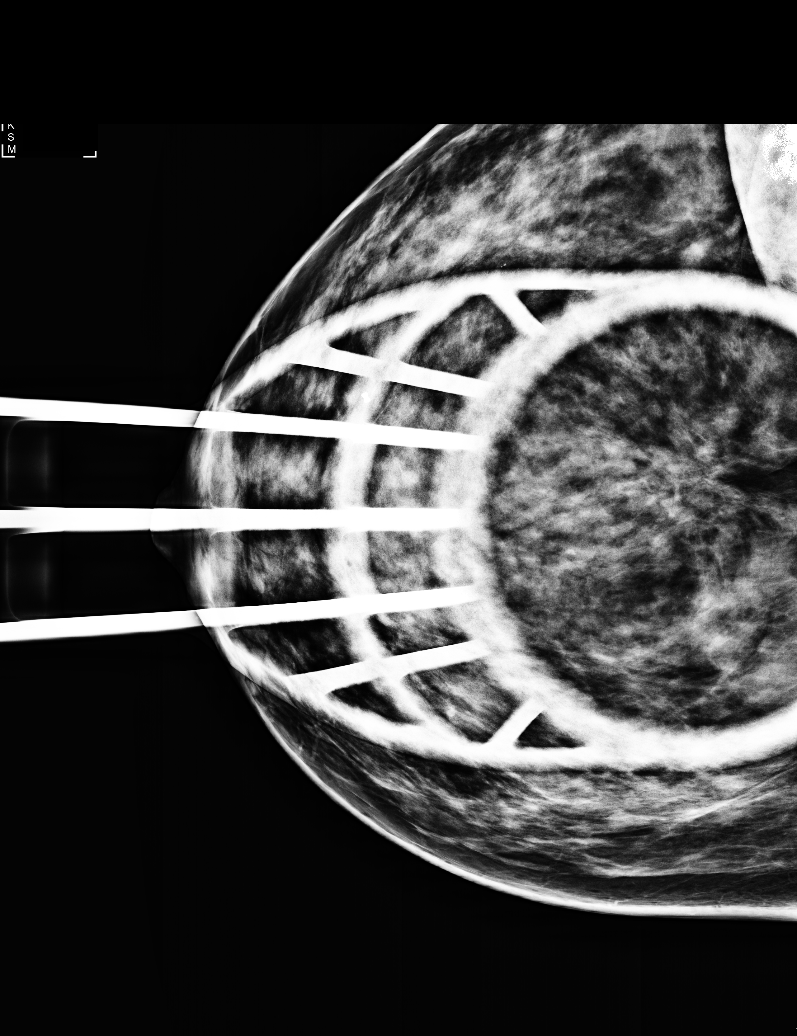

[L CC (1 of 2)]
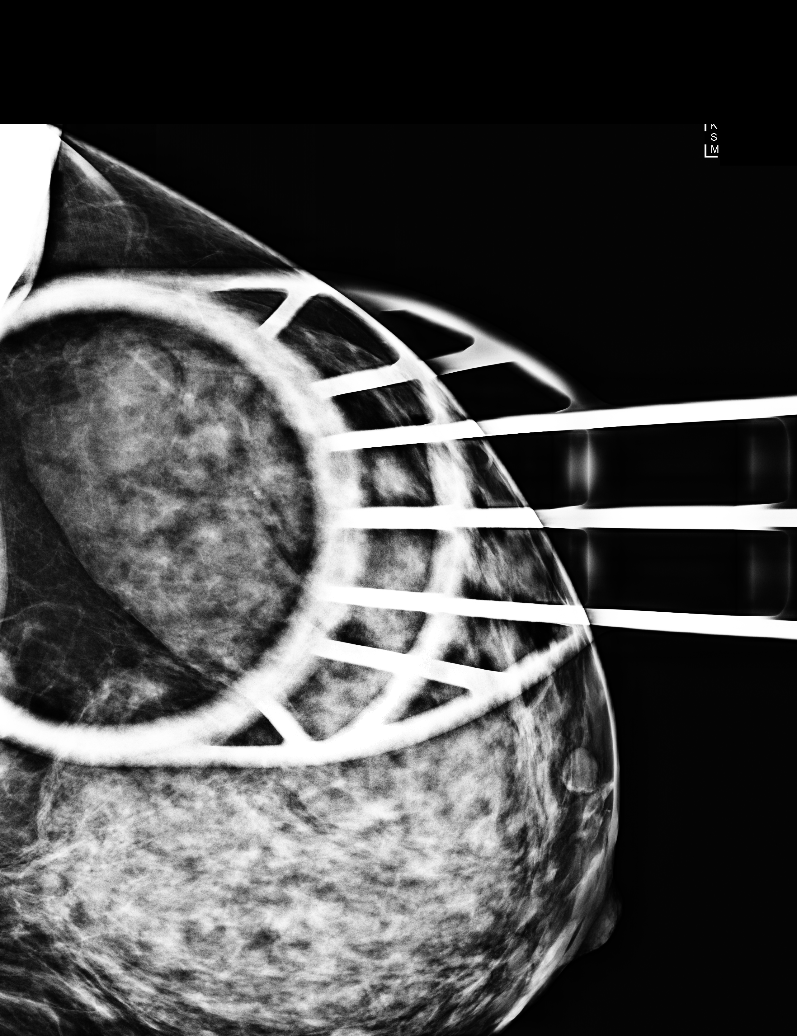

[L CC (2 of 2)]
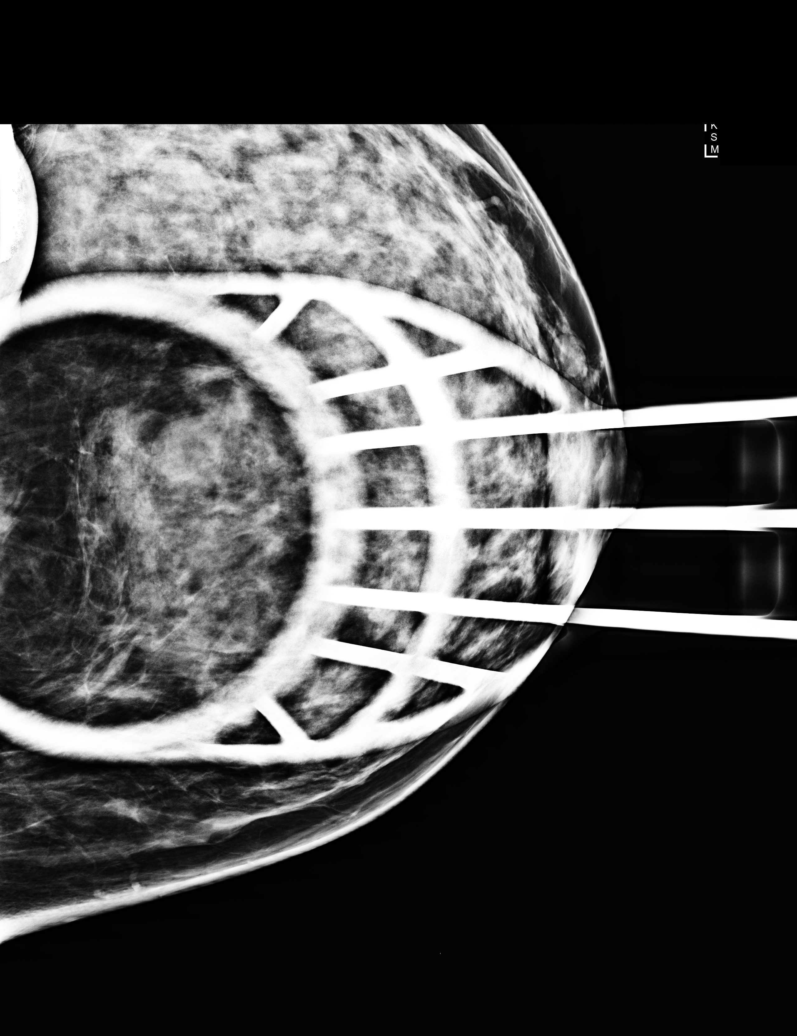

[R MLO]
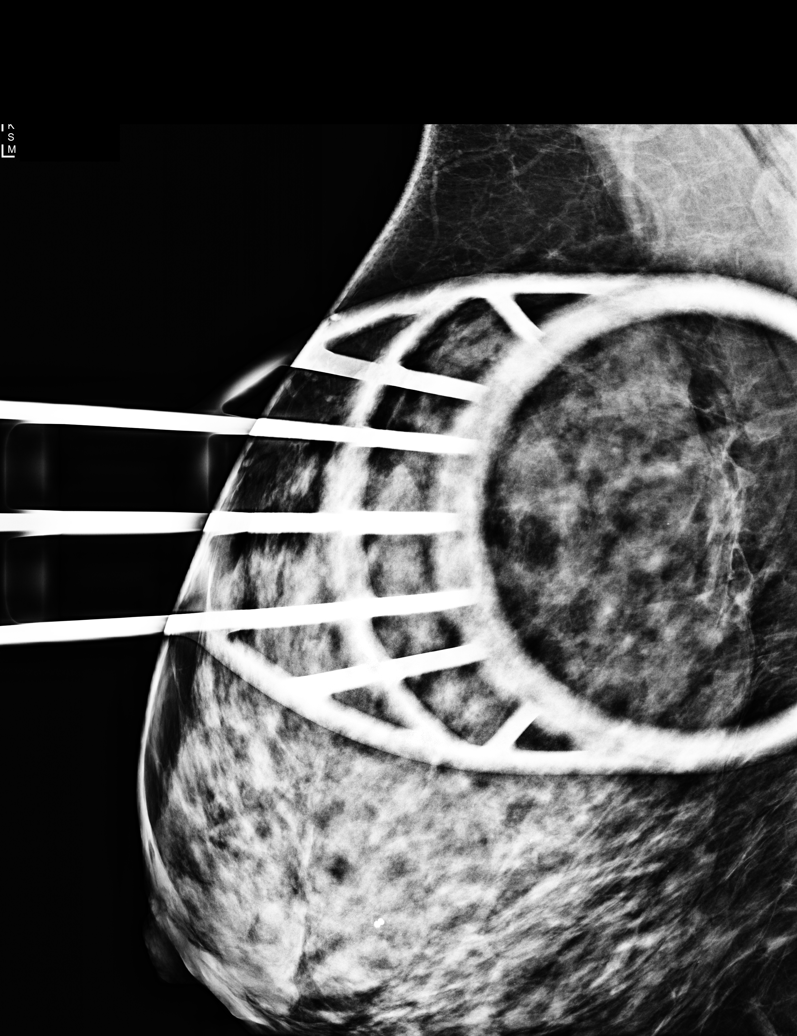

[L MLO]
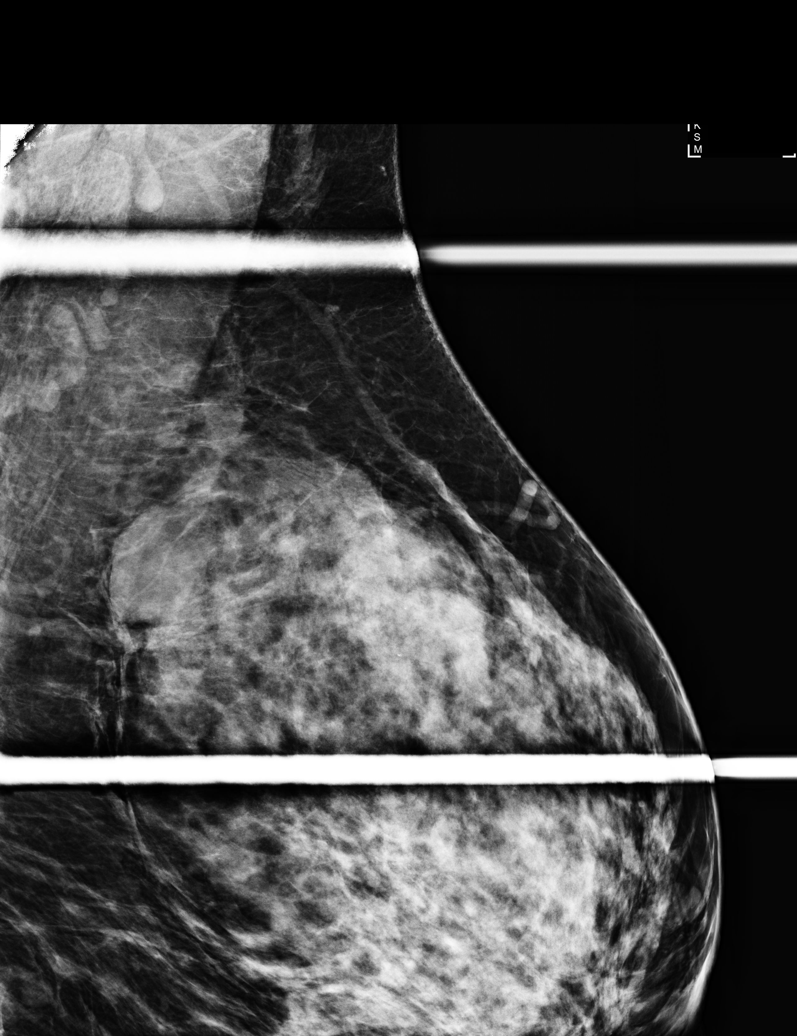

[R CC tomo · tomo slice 35/68.0]
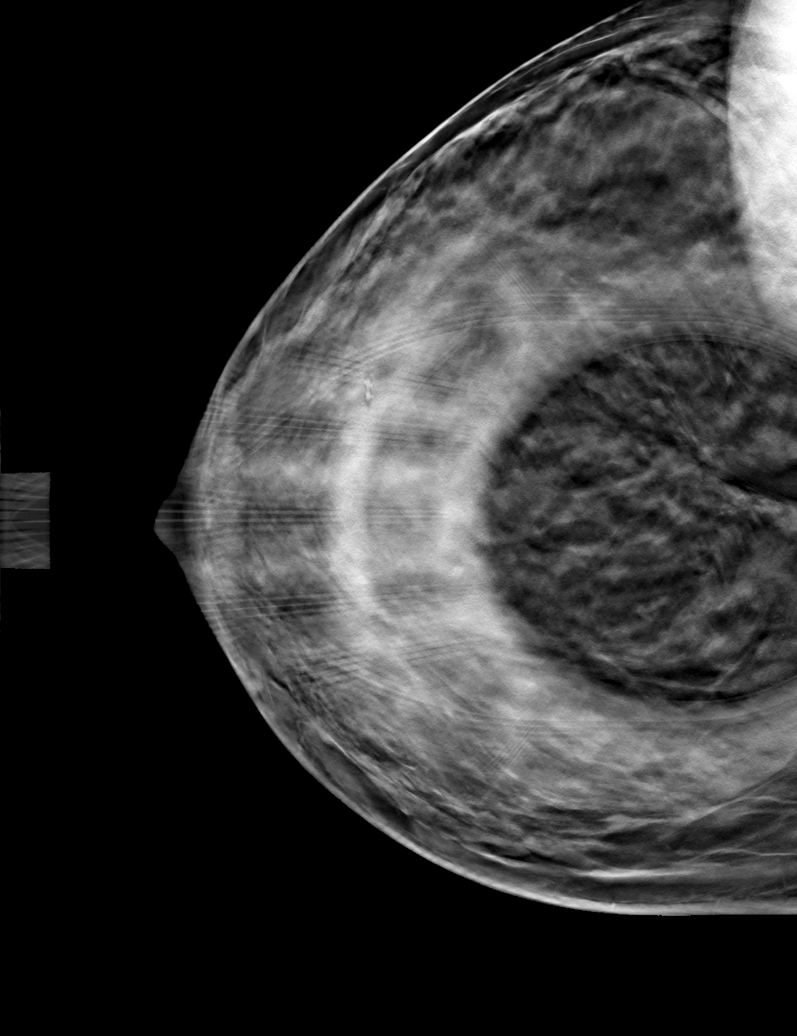

[6 of 30 positions shown; findings below may reference images not displayed]

Breast MRI from June 2014.

ACR Breast Density Category c: The breast tissue is heterogeneously
dense, which may obscure small masses.
FINDINGS: Spot compression tomographic views of the posterior third of the
upper central right breast show persistent architectural distortion,
best seen in the CC projection on slice 28. This area of distortion
measures approximately 3.4 cm greatest diameter. Probable subtle
distortion is seen posteriorly in superior right breast on the MLO
spot compression views. There is no associated mass.

Spot compression tomographic views of the upper outer and upper
central left breast confirm 2 circumscribed oval masses.

Mammographic images were processed with CAD.

On physical exam, I do palpate an approximately lump at 2 o'clock
position left breast 10 cm from the nipple. No additional mass is
palpated in the superior left breast. I do not palpate a mass in the
upper central right breast.

Targeted ultrasound is performed, showing a 2.5 x 2.1 x 1.3 cm
simple cyst at 2 o'clock position 10 cm from the nipple. This cyst
is palpable. At 1 o'clock position 6 cm from nipple is a 1.3 x 0.9 x
1.3 cm cyst. These account for the masses seen at mammography. No
suspicious mass is seen in the superior left breast on ultrasound.

Ultrasound of the superior right breast shows complex
heterogeneously dense breast parenchyma without a discrete area of
distortion. No solid or suspicious mass is identified in the
superior right breast.
IMPRESSION: 1. Subtle area of architectural distortion in the posterior third of
the upper central right breast, best seen in the CC projection.
Complex sclerosing lesion or malignancy cannot be excluded.
2. 2 benign cysts in left breast.  No evidence of malignancy.

RECOMMENDATION:
Stereotactic biopsy of the subtle right breast architectural
distortion is recommended and has been scheduled for July 19, 2015.

It was recommended that the patient have a six-month follow-up
bilateral breast MRI, following her breast MRI guided biopsy in
Wednesday June, 2014. This remains a recommendation, especially given the
patient's significant family history of breast cancer and this was
discussed with the patient today.

I have discussed the findings and recommendations with the patient.
Results were also provided in writing at the conclusion of the
visit. If applicable, a reminder letter will be sent to the patient
regarding the next appointment.

BI-RADS CATEGORY  4: Suspicious.

## 2017-05-17 ENCOUNTER — Other Ambulatory Visit: Payer: Self-pay | Admitting: Obstetrics and Gynecology

## 2017-05-17 DIAGNOSIS — Z1231 Encounter for screening mammogram for malignant neoplasm of breast: Secondary | ICD-10-CM

## 2017-06-05 ENCOUNTER — Ambulatory Visit
Admission: RE | Admit: 2017-06-05 | Discharge: 2017-06-05 | Disposition: A | Discharge status: Home / Self Care | Payer: BC Managed Care – PPO | Source: Ambulatory Visit | Attending: Obstetrics and Gynecology | Admitting: Obstetrics and Gynecology

## 2017-06-05 DIAGNOSIS — Z1231 Encounter for screening mammogram for malignant neoplasm of breast: Secondary | ICD-10-CM

## 2017-06-07 ENCOUNTER — Other Ambulatory Visit: Payer: Self-pay | Admitting: Obstetrics and Gynecology

## 2017-06-07 DIAGNOSIS — R928 Other abnormal and inconclusive findings on diagnostic imaging of breast: Secondary | ICD-10-CM

## 2017-06-12 ENCOUNTER — Ambulatory Visit
Admission: RE | Admit: 2017-06-12 | Discharge: 2017-06-12 | Disposition: A | Discharge status: Home / Self Care | Payer: BC Managed Care – PPO | Source: Ambulatory Visit | Attending: Obstetrics and Gynecology | Admitting: Obstetrics and Gynecology

## 2017-06-12 DIAGNOSIS — R928 Other abnormal and inconclusive findings on diagnostic imaging of breast: Secondary | ICD-10-CM

## 2017-12-26 IMAGING — US ULTRASOUND LEFT BREAST LIMITED
1 series · 8 of 8 positions shown · non-contrast
Comparison: Previous examinations the most recent which is dated
07/19/2015.

CLINICAL DATA: The patient notes a new tender, palpable mass
located within the upper-outer quadrant of the left breast. The
patient has a family history of breast cancer in her mother,
maternal grandmother, her sister, and maternal aunts. Patient has a
greater than 20% lifetime risk of breast cancer. She has undergone a
benign concordant right breast tomosynthesis guided biopsy in June 2015 and previous benign concordant right MR guided biopsy in June 2014. Follow-up breast MRI has been recommended but she states that
this was denied by her insurance company.

EXAM:
2D DIGITAL DIAGNOSTIC BILATERAL MAMMOGRAM WITH CAD AND ADJUNCT TOMO
ULTRASOUND LEFT BREAST

[Series 1: ultrasound left breast limited · 0.07mm/px · 8 of 8 slices shown]
[im 1/8]
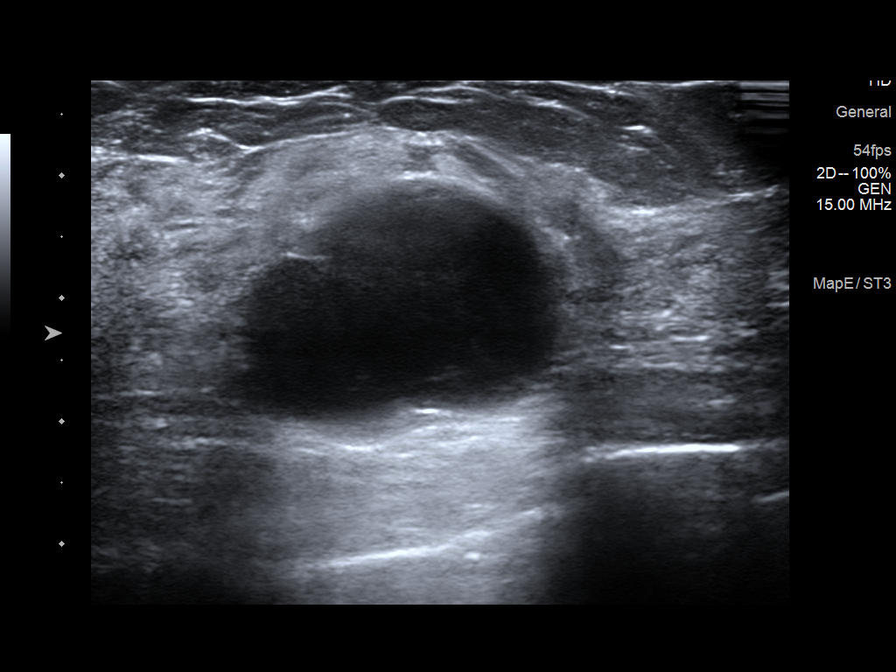
[im 2/8]
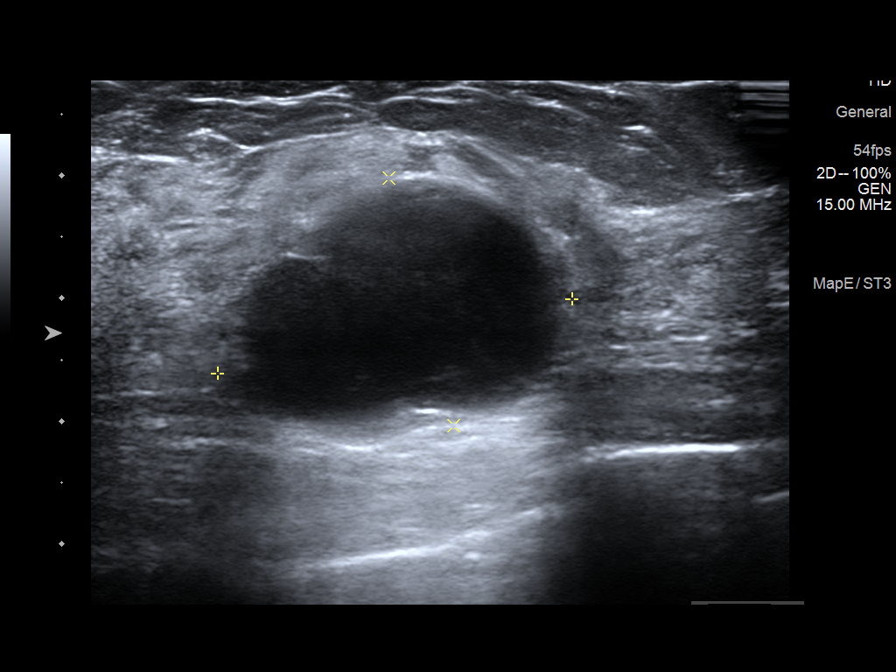
[im 3/8]
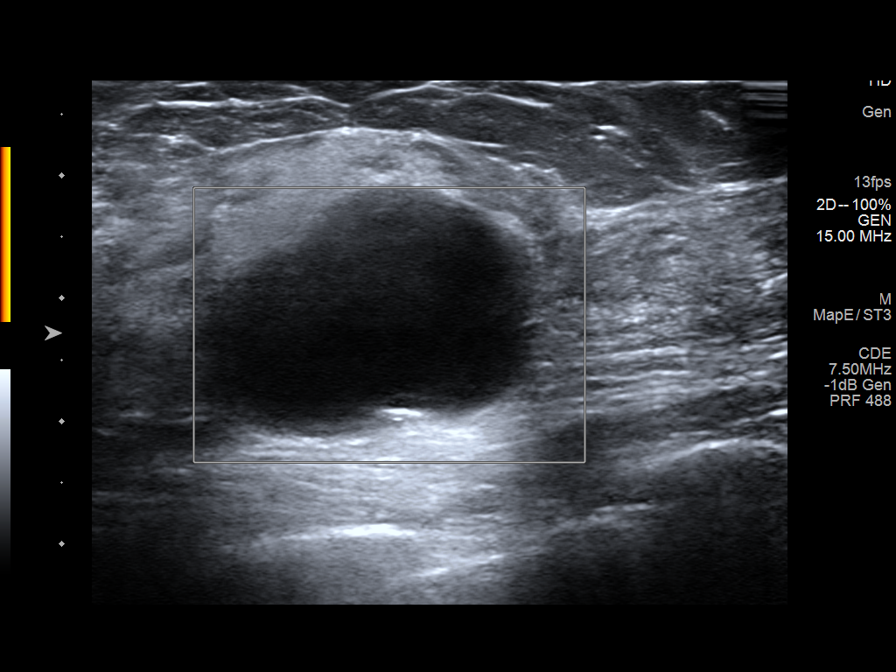
[im 4/8]
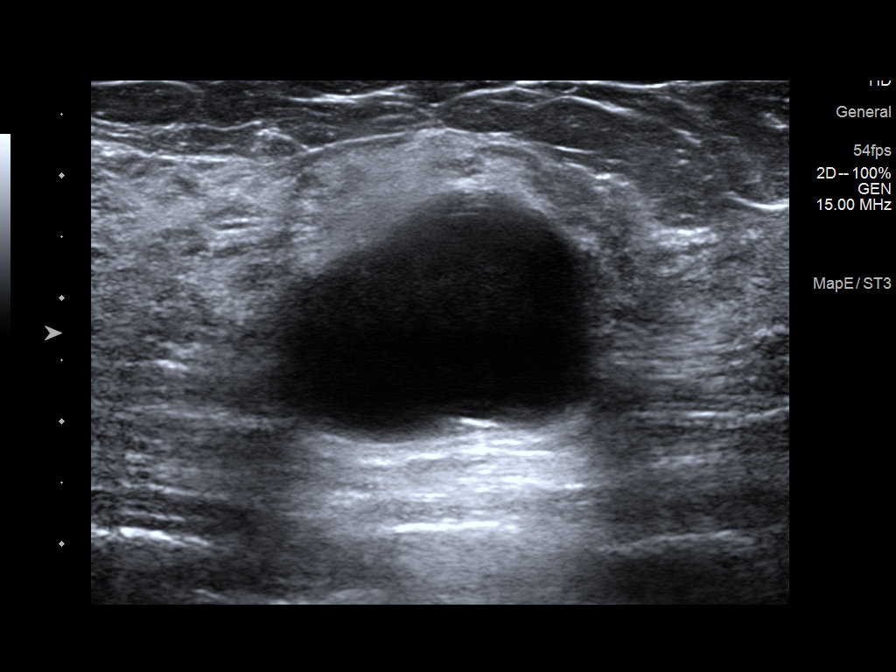
[im 5/8]
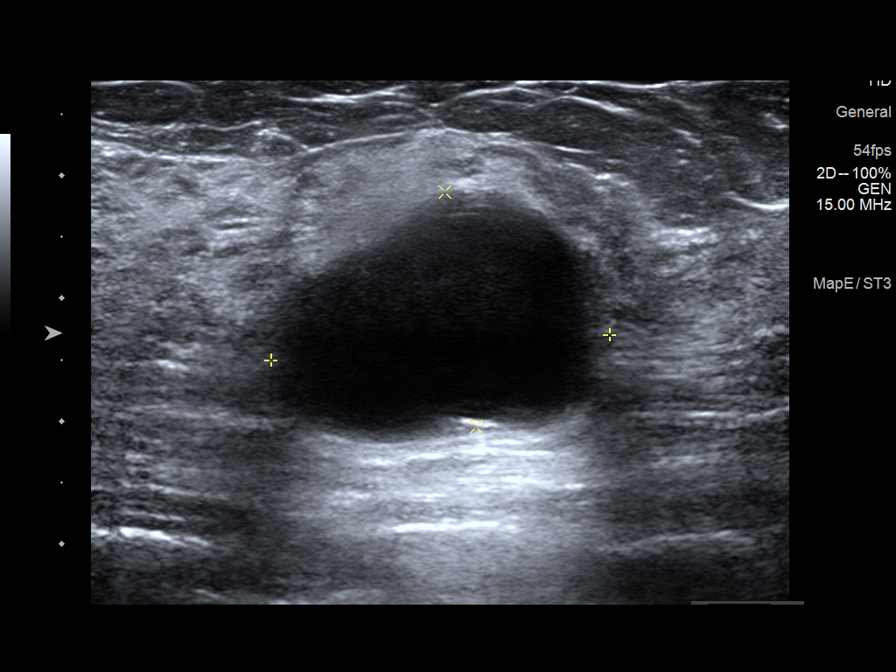
[im 6/8]
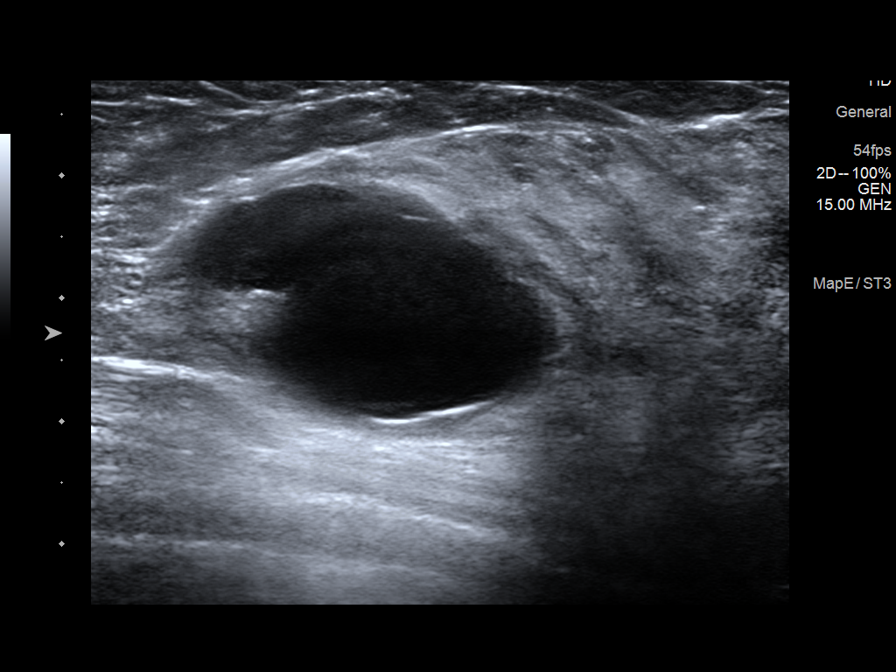
[im 7/8]
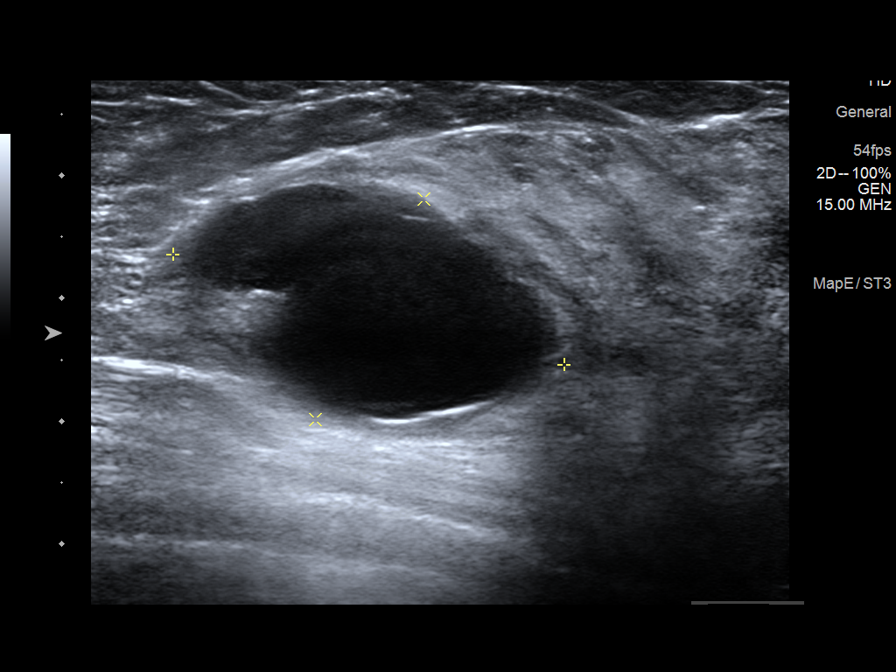
[im 8/8]
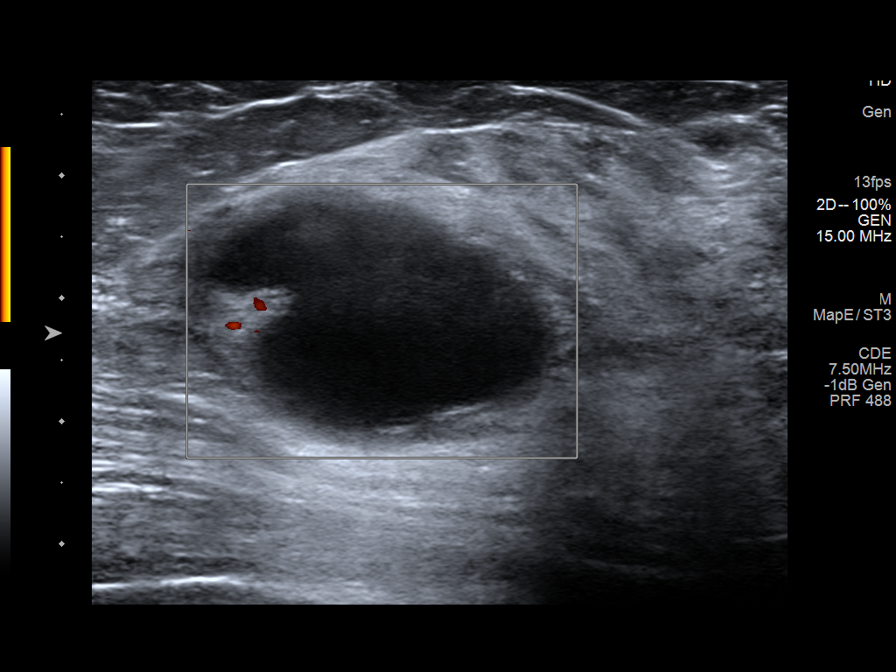

[8 of 8 positions shown; findings below may reference images not displayed]

ACR Breast Density Category d: The breast tissue is extremely dense,
which lowers the sensitivity of mammography.
FINDINGS: There is an extremely dense breast parenchymal pattern. There is a
partially obscured mass located within the upper-outer quadrant of
the left breast at approximately the 2 o'clock position 10 cm from
the nipple corresponding to the palpable area noted by the patient.
There is an area of possible distortion located posteriorly with
within the superior right breast at approximately the 12 o'clock
position with a centrally located X shaped clip related to the
patient's previous tomosynthesis guided biopsy in this region. There
is a dumbbell-shaped clip located within the anterior slightly
lateral right breast related to the patient's previous MR guided
biopsy. There is a stable, circumscribed lesion located within the
left breast at the 1 o'clock position corresponding to the
previously seen simple cyst seen in this region on the prior
examinations.

Mammographic images were processed with CAD.

On physical exam, there is a tender, palpable mass located within
the left breast at 2 o'clock position 10 cm from the nipple. By
physical examination this measures approximately 3 cm in size.

Targeted ultrasound is performed, showing a cyst with low-level
internal echoes and mild diffuse wall thickening located within the
left breast at 2 o'clock position 10 cm from the nipple measuring
3.3 x 2.8 x 2.0 cm in size. This corresponds to the palpable and
mammographic finding. This is consistent with a mildly complicated
cyst and cyst aspiration is recommended. This will be performed and
reported separately.
IMPRESSION: 1. The palpable abnormality located within the left breast at 2
o'clock position 10 cm from the nipple corresponds to a 3.3 cm
mildly complicated cyst. Cyst aspiration is recommended and will be
performed and reported separately.
2. Strong family history of breast cancer with a history of breast
cancer in the patient's mother, sister, maternal grandmother, and
maternal aunts. The patient does have a greater than 20% lifetime
risk of breast cancer and breast MRI is recommended at this time.
3. Area of possible distortion located posteriorly and slightly
superiorly within the right breast which has been previously
biopsied under tomosynthesis guided biopsy. This could also be
evaluated at the time of breast MRI.

RECOMMENDATION:
1. Left breast cyst aspiration.
2. Breast MRI at this time.

I have discussed the findings and recommendations with the patient.
Results were also provided in writing at the conclusion of the
visit. If applicable, a reminder letter will be sent to the patient
regarding the next appointment.

BI-RADS CATEGORY  0: Incomplete. Need additional imaging evaluation
and/or prior mammograms for comparison.

## 2017-12-26 IMAGING — US US BREAST ASPIRATION
1 series · 2 of 2 positions shown · non-contrast
Comparison: Previous exams.

CLINICAL DATA: Tender, complicated left breast cyst.

EXAM:
ULTRASOUND GUIDED LEFT BREAST CYST ASPIRATION

[Series 1: us breast aspiration · 0.07mm/px · 2 of 2 slices shown]
[im 1/2]
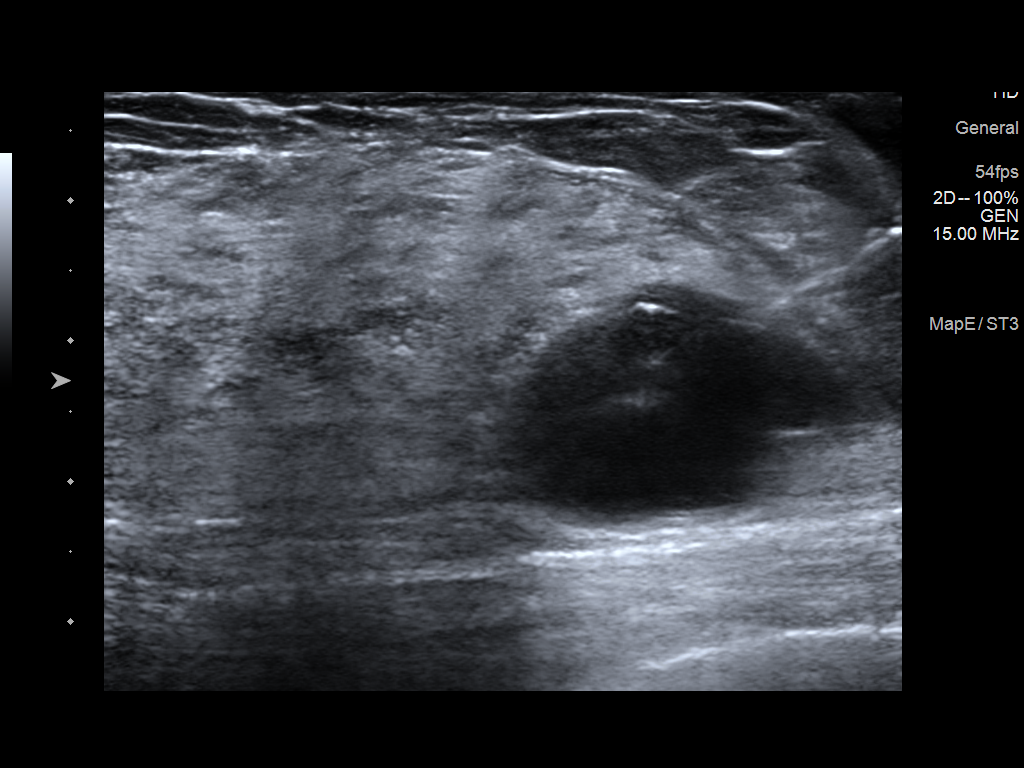
[im 2/2]
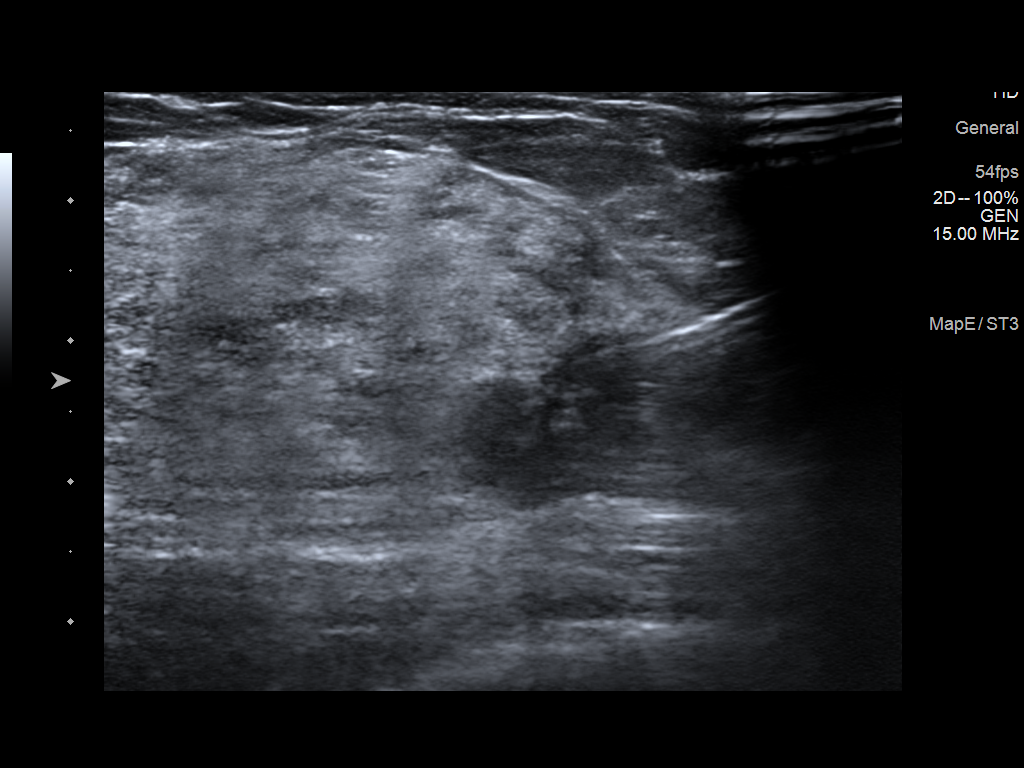

[2 of 2 positions shown; findings below may reference images not displayed]

PROCEDURE:
Using sterile technique, 1% lidocaine, under direct ultrasound
visualization, needle aspiration of the tender complicated cyst
located within the left breast at 2 o'clock position was performed.
5 cc of amber has, nonhemorrhagic fluid were aspirated. The cyst
collapsed completely.
IMPRESSION: Ultrasound-guided aspiration of the tender complicated left breast
cyst located at 2 o'clock position as discussed above No apparent
complications.

RECOMMENDATIONS:
Breast MRI at this time.  Please see diagnostic mammogram report.

## 2018-02-18 ENCOUNTER — Ambulatory Visit: Payer: BC Managed Care – PPO | Admitting: Cardiovascular Disease

## 2018-03-21 ENCOUNTER — Encounter: Payer: Self-pay | Admitting: Cardiovascular Disease

## 2018-03-21 ENCOUNTER — Ambulatory Visit: Payer: BC Managed Care – PPO | Admitting: Cardiovascular Disease

## 2018-03-21 VITALS — BP 122/70 | HR 78 | Ht 64.0 in | Wt 165.0 lb

## 2018-03-21 DIAGNOSIS — R002 Palpitations: Secondary | ICD-10-CM

## 2018-03-21 NOTE — Patient Instructions (Signed)
Medication Instructions:  NONE If you need a refill on your cardiac medications before your next appointment, please call your pharmacy.   Lab work: NONE If you have labs (blood work) drawn today and your tests are completely normal, you will receive your results only by: Marland Kitchen MyChart Message (if you have MyChart) OR . A paper copy in the mail If you have any lab test that is abnormal or we need to change your treatment, we will call you to review the results.  Testing/Procedures: NONE  Follow-Up: At [facility], you and your health needs are our priority.  As part of our continuing mission to provide you with exceptional heart care, we have created designated Provider Care Teams.  These Care Teams include your primary Cardiologist (physician) and Advanced Practice Providers (APPs -  Physician Assistants and Nurse Practitioners) who all work together to provide you with the care you need, when you need it. You may schedule a follow up appointment as needed.You may see Dr. Gwenlyn Found or one of the following Advanced Practice Providers on your designated Care Team:   Kerin Ransom, PA-C Roby Lofts, [place] . Sande Rives, PA-C

## 2018-03-21 NOTE — Assessment & Plan Note (Signed)
[name] was referred by Dr. Zadie Rhine for palpitations.  Actually evaluated in 2015 by Dr. Debara Pickett with an echo, routine GXT and event monitor and was found only to have PVCs.  She has had these recurrently over the last 5 years however they were more noticeable this fall when she was under excessive stress.  She was recently begun on Zoloft and to start exercising again and her symptoms have markedly improved.  At this point, I do not feel compelled to repeat the work-up that is already been performed especially in light of the fact that her symptoms are improving with minimal intervention.

## 2018-03-21 NOTE — Progress Notes (Signed)
03/21/2018 [name]   Jun 05, 1965  [id number]  Primary Physician Rankins, Bill Salinas, MD Primary Cardiologist: Lorretta Harp MD Lupe Carney, [place]  HPI:  [name] is a 52 y.o. mildly overweight married Caucasian female mother of 2 teenagers who referred by Dr. Zadie Rhine for cardiovascular valuation because of palpitations.  She has no cardiac risk factors.  She was evaluated by Dr. Debara Pickett 2015 work when she was referred by Dr. Cletis Media for murmur and she had a 2D echo which was entirely normal, exercise stress test which was normal as well and an event monitor that only showed isolated PVCs.  She is done well over the last 5 years with some occasional palpitations which are become more noticeable this fall when she has been under unusual stress.  She was recently begun on Zoloft and started exercising her symptoms have markedly improved.   Current Meds  Medication Sig  . Multiple Vitamin (MULTIVITAMIN) capsule Take 1 capsule by mouth daily.    . sertraline (ZOLOFT) 25 MG tablet Take 25 mg by mouth daily.     Allergies  Allergen Reactions  . Sulfa Drugs Cross Reactors Nausea Only and Rash    Social History   Socioeconomic History  . Marital status: Married    Spouse name: Not on file  . Number of children: 2  . Years of education: masters  . Highest education level: Not on file  Occupational History  . Occupation: Optometrist: [employer]  . Financial resource strain: Not on file  . Food insecurity:    Worry: Not on file    Inability: Not on file  . Transportation needs:    Medical: Not on file    Non-medical: Not on file  Tobacco Use  . Smoking status: Never Smoker  . Smokeless tobacco: Never Used  Substance and Sexual Activity  . Alcohol use: Yes    Alcohol/week: 3.0 - 4.0 standard drinks    Types: 3 - 4 drink(s) per week    Comment: wine/beer on weekends  . Drug use: No  . Sexual activity: Not Currently   Birth control/protection: Condom, None  Lifestyle  . Physical activity:    Days per week: Not on file    Minutes per session: Not on file  . Stress: Not on file  Relationships  . Social connections:    Talks on phone: Not on file    Gets together: Not on file    Attends religious service: Not on file    Active member of club or organization: Not on file    Attends meetings of clubs or organizations: Not on file    Relationship status: Not on file  . Intimate partner violence:    Fear of current or ex partner: Not on file    Emotionally abused: Not on file    Physically abused: Not on file    Forced sexual activity: Not on file  Other Topics Concern  . Not on file  Social History Narrative  . Not on file     Review of Systems: General: negative for chills, fever, night sweats or weight changes.  Cardiovascular: negative for chest pain, dyspnea on exertion, edema, orthopnea, palpitations, paroxysmal nocturnal dyspnea or shortness of breath Dermatological: negative for rash Respiratory: negative for cough or wheezing Urologic: negative for hematuria Abdominal: negative for nausea, vomiting, diarrhea, bright red blood per rectum, melena, or hematemesis Neurologic: negative for  visual changes, syncope, or dizziness All other systems reviewed and are otherwise negative except as noted above.    Blood pressure 122/70, pulse 78, height 5\' 4"  (1.626 m), weight 165 lb (74.8 kg).  General appearance: alert and no distress Neck: no adenopathy, no carotid bruit, no JVD, supple, symmetrical, trachea midline and thyroid not enlarged, symmetric, no tenderness/mass/nodules Lungs: clear to auscultation bilaterally Heart: regular rate and rhythm, S1, S2 normal, no murmur, click, rub or gallop Extremities: extremities normal, atraumatic, no cyanosis or edema Pulses: 2+ and symmetric Skin: Skin color, texture, turgor normal. No rashes or lesions Neurologic: Alert and oriented X 3, normal  strength and tone. Normal symmetric reflexes. Normal coordination and gait  EKG sinus rhythm at 78 without ST or T wave changes. I Personally reviewed this EKG.  ASSESSMENT AND PLAN:   Palpitations [name] was referred by Dr. Zadie Rhine for palpitations.  Actually evaluated in 2015 by Dr. Debara Pickett with an echo, routine GXT and event monitor and was found only to have PVCs.  She has had these recurrently over the last 5 years however they were more noticeable this fall when she was under excessive stress.  She was recently begun on Zoloft and to start exercising again and her symptoms have markedly improved.  At this point, I do not feel compelled to repeat the work-up that is already been performed especially in light of the fact that her symptoms are improving with minimal intervention.      Lorretta Harp MD FACP,FACC,FAHA, [facility] 03/21/2018 11:49 AM

## 2018-04-29 ENCOUNTER — Other Ambulatory Visit: Payer: Self-pay | Admitting: Obstetrics and Gynecology

## 2018-04-29 DIAGNOSIS — Z1231 Encounter for screening mammogram for malignant neoplasm of breast: Secondary | ICD-10-CM

## 2018-06-09 ENCOUNTER — Ambulatory Visit: Payer: BC Managed Care – PPO

## 2018-07-21 ENCOUNTER — Ambulatory Visit: Payer: BC Managed Care – PPO

## 2018-10-01 ENCOUNTER — Other Ambulatory Visit: Payer: Self-pay

## 2018-10-01 ENCOUNTER — Ambulatory Visit
Admission: RE | Admit: 2018-10-01 | Discharge: 2018-10-01 | Disposition: A | Discharge status: Home / Self Care | Payer: BC Managed Care – PPO | Source: Ambulatory Visit | Attending: Obstetrics and Gynecology | Admitting: Obstetrics and Gynecology

## 2018-10-01 DIAGNOSIS — Z1231 Encounter for screening mammogram for malignant neoplasm of breast: Secondary | ICD-10-CM

## 2019-05-23 ENCOUNTER — Other Ambulatory Visit: Payer: Self-pay

## 2019-05-23 ENCOUNTER — Ambulatory Visit: Payer: BC Managed Care – PPO | Attending: Internal Medicine

## 2019-05-23 DIAGNOSIS — Z23 Encounter for immunization: Secondary | ICD-10-CM | POA: Insufficient documentation

## 2019-05-23 NOTE — Progress Notes (Signed)
   Covid-19 Vaccination Clinic  Name:  [name]    MRN: [id number] DOB: [birth year]  05/23/2019  [name] was observed post Covid-19 immunization for 15 minutes without incidence. She was provided with Vaccine Information Sheet and instruction to access the V-Safe system.   [name] was instructed to call 911 with any severe reactions post vaccine: Marland Kitchen Difficulty breathing  . Swelling of your face and throat  . A fast heartbeat  . A bad rash all over your body  . Dizziness and weakness    Immunizations Administered    Name Date Dose VIS Date Route   Pfizer COVID-19 Vaccine 05/23/2019 11:04 AM 0.3 mL 03/06/2019 Intramuscular   Manufacturer: [place]   Lot: [id number]   [employer]: [id number]

## 2019-06-13 ENCOUNTER — Ambulatory Visit: Payer: BC Managed Care – PPO | Attending: Internal Medicine

## 2019-06-13 DIAGNOSIS — Z23 Encounter for immunization: Secondary | ICD-10-CM

## 2019-06-13 NOTE — Progress Notes (Signed)
   Covid-19 Vaccination Clinic  Name:  Shamyiah Kaeo    MRN: [id number] DOB: [birth year]  06/13/2019  Ms. Laumann was observed post Covid-19 immunization for 30 minutes based on pre-vaccination screening without incident. She was provided with Vaccine Information Sheet and instruction to access the V-Safe system.   Ms. Preville was instructed to call 911 with any severe reactions post vaccine: Marland Kitchen Difficulty breathing  . Swelling of face and throat  . A fast heartbeat  . A bad rash all over body  . Dizziness and weakness   Immunizations Administered    Name Date Dose VIS Date Route   Pfizer COVID-19 Vaccine 06/13/2019 12:57 PM 0.3 mL 03/06/2019 Intramuscular   Manufacturer: [place]   Lot: [id number]   [place]: [id number]

## 2019-06-17 ENCOUNTER — Ambulatory Visit: Payer: BC Managed Care – PPO

## 2019-08-31 ENCOUNTER — Other Ambulatory Visit: Payer: Self-pay | Admitting: Obstetrics and Gynecology

## 2019-08-31 DIAGNOSIS — Z1231 Encounter for screening mammogram for malignant neoplasm of breast: Secondary | ICD-10-CM

## 2019-09-14 ENCOUNTER — Other Ambulatory Visit: Payer: Self-pay | Admitting: Obstetrics and Gynecology

## 2019-09-14 DIAGNOSIS — N6320 Unspecified lump in the left breast, unspecified quadrant: Secondary | ICD-10-CM

## 2019-09-16 ENCOUNTER — Other Ambulatory Visit: Payer: Self-pay | Admitting: Obstetrics and Gynecology

## 2019-09-16 DIAGNOSIS — R140 Abdominal distension (gaseous): Secondary | ICD-10-CM

## 2019-09-23 ENCOUNTER — Ambulatory Visit
Admission: RE | Admit: 2019-09-23 | Discharge: 2019-09-23 | Disposition: A | Discharge status: Home / Self Care | Payer: BC Managed Care – PPO | Source: Ambulatory Visit | Attending: Obstetrics and Gynecology | Admitting: Obstetrics and Gynecology

## 2019-09-23 DIAGNOSIS — R140 Abdominal distension (gaseous): Secondary | ICD-10-CM

## 2019-09-25 ENCOUNTER — Other Ambulatory Visit: Payer: Self-pay

## 2019-09-25 ENCOUNTER — Other Ambulatory Visit: Payer: Self-pay | Admitting: Obstetrics and Gynecology

## 2019-09-25 ENCOUNTER — Ambulatory Visit
Admission: RE | Admit: 2019-09-25 | Discharge: 2019-09-25 | Disposition: A | Discharge status: Home / Self Care | Payer: BC Managed Care – PPO | Source: Ambulatory Visit | Attending: Obstetrics and Gynecology | Admitting: Obstetrics and Gynecology

## 2019-09-25 DIAGNOSIS — N6320 Unspecified lump in the left breast, unspecified quadrant: Secondary | ICD-10-CM

## 2019-09-30 ENCOUNTER — Ambulatory Visit
Admission: RE | Admit: 2019-09-30 | Discharge: 2019-09-30 | Disposition: A | Discharge status: Home / Self Care | Payer: BC Managed Care – PPO | Source: Ambulatory Visit | Attending: Obstetrics and Gynecology | Admitting: Obstetrics and Gynecology

## 2019-09-30 ENCOUNTER — Other Ambulatory Visit: Payer: Self-pay

## 2019-09-30 DIAGNOSIS — Z1231 Encounter for screening mammogram for malignant neoplasm of breast: Secondary | ICD-10-CM

## 2019-10-02 ENCOUNTER — Ambulatory Visit: Payer: BC Managed Care – PPO

## 2019-10-02 ENCOUNTER — Other Ambulatory Visit: Payer: BC Managed Care – PPO

## 2019-10-06 ENCOUNTER — Other Ambulatory Visit: Payer: Self-pay | Admitting: Obstetrics and Gynecology

## 2019-10-06 DIAGNOSIS — R928 Other abnormal and inconclusive findings on diagnostic imaging of breast: Secondary | ICD-10-CM

## 2019-10-08 ENCOUNTER — Ambulatory Visit: Admission: RE | Admit: 2019-10-08 | Payer: BC Managed Care – PPO | Source: Ambulatory Visit

## 2019-10-08 ENCOUNTER — Ambulatory Visit
Admission: RE | Admit: 2019-10-08 | Discharge: 2019-10-08 | Disposition: A | Discharge status: Home / Self Care | Payer: BC Managed Care – PPO | Source: Ambulatory Visit | Attending: Obstetrics and Gynecology | Admitting: Obstetrics and Gynecology

## 2019-10-08 ENCOUNTER — Other Ambulatory Visit: Payer: Self-pay

## 2019-10-08 DIAGNOSIS — R928 Other abnormal and inconclusive findings on diagnostic imaging of breast: Secondary | ICD-10-CM

## 2019-10-15 ENCOUNTER — Other Ambulatory Visit: Payer: Self-pay | Admitting: Surgery

## 2019-10-15 DIAGNOSIS — R1031 Right lower quadrant pain: Secondary | ICD-10-CM

## 2019-10-19 ENCOUNTER — Ambulatory Visit
Admission: RE | Admit: 2019-10-19 | Discharge: 2019-10-19 | Disposition: A | Discharge status: Home / Self Care | Payer: BC Managed Care – PPO | Source: Ambulatory Visit | Attending: Surgery | Admitting: Surgery

## 2019-10-19 ENCOUNTER — Other Ambulatory Visit: Payer: Self-pay

## 2019-10-19 DIAGNOSIS — R1031 Right lower quadrant pain: Secondary | ICD-10-CM

## 2019-10-19 MED ORDER — IOPAMIDOL (ISOVUE-300) INJECTION 61%
100.0000 mL | Freq: Once | INTRAVENOUS | Status: AC | PRN
  Administered 2019-10-19: 100 mL via INTRAVENOUS

## 2020-07-04 ENCOUNTER — Other Ambulatory Visit: Payer: Self-pay | Admitting: Obstetrics and Gynecology

## 2020-07-04 DIAGNOSIS — Z1231 Encounter for screening mammogram for malignant neoplasm of breast: Secondary | ICD-10-CM

## 2020-08-02 ENCOUNTER — Other Ambulatory Visit: Payer: Self-pay | Admitting: Obstetrics and Gynecology

## 2020-08-02 DIAGNOSIS — Z1231 Encounter for screening mammogram for malignant neoplasm of breast: Secondary | ICD-10-CM

## 2020-08-14 ENCOUNTER — Other Ambulatory Visit: Payer: BC Managed Care – PPO

## 2020-09-30 ENCOUNTER — Other Ambulatory Visit: Payer: Self-pay

## 2020-09-30 ENCOUNTER — Ambulatory Visit
Admission: RE | Admit: 2020-09-30 | Discharge: 2020-09-30 | Disposition: A | Discharge status: Home / Self Care | Payer: BC Managed Care – PPO | Source: Ambulatory Visit | Attending: Obstetrics and Gynecology | Admitting: Obstetrics and Gynecology

## 2020-09-30 DIAGNOSIS — Z1231 Encounter for screening mammogram for malignant neoplasm of breast: Secondary | ICD-10-CM

## 2020-11-24 ENCOUNTER — Encounter (INDEPENDENT_AMBULATORY_CARE_PROVIDER_SITE_OTHER): Payer: Self-pay

## 2020-11-29 ENCOUNTER — Encounter (INDEPENDENT_AMBULATORY_CARE_PROVIDER_SITE_OTHER): Payer: Self-pay

## 2021-06-24 ENCOUNTER — Ambulatory Visit
Admission: RE | Admit: 2021-06-24 | Discharge: 2021-06-24 | Disposition: A | Discharge status: Home / Self Care | Payer: 59 | Source: Ambulatory Visit | Attending: Obstetrics and Gynecology | Admitting: Obstetrics and Gynecology

## 2021-06-24 DIAGNOSIS — Z1231 Encounter for screening mammogram for malignant neoplasm of breast: Secondary | ICD-10-CM

## 2021-06-24 MED ORDER — GADOBUTROL 1 MMOL/ML IV SOLN
8.0000 mL | Freq: Once | INTRAVENOUS | Status: AC | PRN
  Administered 2021-06-24: 8 mL via INTRAVENOUS

## 2021-08-31 ENCOUNTER — Other Ambulatory Visit: Payer: Self-pay | Admitting: Obstetrics and Gynecology

## 2021-08-31 DIAGNOSIS — Z1231 Encounter for screening mammogram for malignant neoplasm of breast: Secondary | ICD-10-CM

## 2021-10-02 ENCOUNTER — Ambulatory Visit
Admission: RE | Admit: 2021-10-02 | Discharge: 2021-10-02 | Disposition: A | Discharge status: Home / Self Care | Payer: No Typology Code available for payment source | Source: Ambulatory Visit | Attending: Obstetrics and Gynecology | Admitting: Obstetrics and Gynecology

## 2021-10-02 DIAGNOSIS — Z1231 Encounter for screening mammogram for malignant neoplasm of breast: Secondary | ICD-10-CM

## 2021-10-04 ENCOUNTER — Other Ambulatory Visit: Payer: Self-pay | Admitting: Obstetrics and Gynecology

## 2021-10-04 DIAGNOSIS — R928 Other abnormal and inconclusive findings on diagnostic imaging of breast: Secondary | ICD-10-CM

## 2021-10-11 ENCOUNTER — Ambulatory Visit: Payer: No Typology Code available for payment source

## 2021-10-11 ENCOUNTER — Ambulatory Visit
Admission: RE | Admit: 2021-10-11 | Discharge: 2021-10-11 | Disposition: A | Discharge status: Home / Self Care | Payer: No Typology Code available for payment source | Source: Ambulatory Visit | Attending: Obstetrics and Gynecology | Admitting: Obstetrics and Gynecology

## 2021-10-11 DIAGNOSIS — R928 Other abnormal and inconclusive findings on diagnostic imaging of breast: Secondary | ICD-10-CM

## 2021-11-01 ENCOUNTER — Encounter (INDEPENDENT_AMBULATORY_CARE_PROVIDER_SITE_OTHER): Payer: Self-pay

## 2022-07-19 ENCOUNTER — Encounter: Payer: Self-pay | Admitting: Obstetrics and Gynecology

## 2022-07-19 ENCOUNTER — Other Ambulatory Visit: Payer: Self-pay | Admitting: Obstetrics and Gynecology

## 2022-07-19 DIAGNOSIS — N6310 Unspecified lump in the right breast, unspecified quadrant: Secondary | ICD-10-CM

## 2022-08-08 ENCOUNTER — Ambulatory Visit
Admission: RE | Admit: 2022-08-08 | Discharge: 2022-08-08 | Disposition: A | Discharge status: Home / Self Care | Payer: BC Managed Care – PPO | Source: Ambulatory Visit | Attending: Obstetrics and Gynecology | Admitting: Obstetrics and Gynecology

## 2022-08-08 ENCOUNTER — Ambulatory Visit
Admission: RE | Admit: 2022-08-08 | Discharge: 2022-08-08 | Disposition: A | Discharge status: Home / Self Care | Payer: No Typology Code available for payment source | Source: Ambulatory Visit | Attending: Obstetrics and Gynecology | Admitting: Obstetrics and Gynecology

## 2022-08-08 DIAGNOSIS — N6310 Unspecified lump in the right breast, unspecified quadrant: Secondary | ICD-10-CM

## 2022-09-10 ENCOUNTER — Other Ambulatory Visit: Payer: Self-pay | Admitting: Obstetrics and Gynecology

## 2022-09-10 DIAGNOSIS — Z1231 Encounter for screening mammogram for malignant neoplasm of breast: Secondary | ICD-10-CM

## 2022-10-04 ENCOUNTER — Ambulatory Visit
Admission: RE | Admit: 2022-10-04 | Discharge: 2022-10-04 | Disposition: A | Discharge status: Home / Self Care | Payer: BC Managed Care – PPO | Source: Ambulatory Visit | Attending: Obstetrics and Gynecology | Admitting: Obstetrics and Gynecology

## 2022-10-04 DIAGNOSIS — Z1231 Encounter for screening mammogram for malignant neoplasm of breast: Secondary | ICD-10-CM

## 2023-05-13 DIAGNOSIS — M25551 Pain in right hip: Secondary | ICD-10-CM | POA: Insufficient documentation

## 2023-06-13 DIAGNOSIS — M1611 Unilateral primary osteoarthritis, right hip: Secondary | ICD-10-CM | POA: Insufficient documentation

## 2023-09-09 ENCOUNTER — Other Ambulatory Visit: Payer: Self-pay | Admitting: Obstetrics and Gynecology

## 2023-09-09 DIAGNOSIS — Z1231 Encounter for screening mammogram for malignant neoplasm of breast: Secondary | ICD-10-CM

## 2023-09-13 ENCOUNTER — Telehealth: Payer: Self-pay

## 2023-09-13 NOTE — Telephone Encounter (Signed)
   Pre-operative Risk Assessment    Patient Name: [name]  DOB: [birth year] MRN: [id number]   Date of last office visit: 02/2718 Lauro Portal, MD Date of next office visit: NONE   Request for Surgical Clearance    Procedure:  RIGHT TOTAL HIP ARTHROPLASTY  Date of Surgery:  Clearance 10/21/23                                Surgeon:  DR Claiborne Crew Surgeon's Group or Practice Name:  [facility] Phone number:  [phone] Fax number:  [phone]    ATTN: Valinda Gault WILLS   Type of Clearance Requested:   - Medical    Type of Anesthesia:  Spinal   Additional requests/questions:    SignedCollin Deal   09/13/2023, 1:17 PM

## 2023-09-13 NOTE — Telephone Encounter (Signed)
 Left message for patient to call our office for  New Pt appt and preop clearance. Told the patient to call our office today and ask for the preop team to get this appt schedule

## 2023-09-13 NOTE — Telephone Encounter (Signed)
   Name: [name]  DOB: 11/21/1965  MRN: [id number]  Primary Cardiologist: None  Chart reviewed as part of pre-operative protocol coverage. Because of [name]'s past medical history and time since last visit, she will require a follow-up in-office visit in order to better assess preoperative cardiovascular risk. Has not seen Dr. Katheryne Pane 03/21/2018  Pre-op covering staff: - Please schedule appointment and call patient to inform them. If patient already had an upcoming appointment within acceptable timeframe, please add pre-op clearance to the appointment notes so provider is aware. - Please contact requesting surgeon's office via preferred method (i.e, phone, fax) to inform them of need for appointment prior to surgery.    Friddie Jetty, NP  09/13/2023, 1:27 PM

## 2023-09-13 NOTE — Telephone Encounter (Signed)
 Pt scheduled new pt appt with Dr. Ronell Coe 09/30/23. I will update all parties involved.

## 2023-09-25 DIAGNOSIS — Z1211 Encounter for screening for malignant neoplasm of colon: Secondary | ICD-10-CM | POA: Insufficient documentation

## 2023-09-25 DIAGNOSIS — Z803 Family history of malignant neoplasm of breast: Secondary | ICD-10-CM | POA: Insufficient documentation

## 2023-09-25 DIAGNOSIS — N879 Dysplasia of cervix uteri, unspecified: Secondary | ICD-10-CM | POA: Insufficient documentation

## 2023-09-25 DIAGNOSIS — R300 Dysuria: Secondary | ICD-10-CM | POA: Insufficient documentation

## 2023-09-25 DIAGNOSIS — E663 Overweight: Secondary | ICD-10-CM | POA: Insufficient documentation

## 2023-09-25 DIAGNOSIS — Z01810 Encounter for preprocedural cardiovascular examination: Secondary | ICD-10-CM | POA: Insufficient documentation

## 2023-09-30 ENCOUNTER — Ambulatory Visit: Payer: Self-pay

## 2023-09-30 VITALS — BP 120/80 | HR 83 | Ht 64.0 in | Wt 173.0 lb

## 2023-09-30 DIAGNOSIS — Z01810 Encounter for preprocedural cardiovascular examination: Secondary | ICD-10-CM | POA: Diagnosis not present

## 2023-09-30 DIAGNOSIS — R002 Palpitations: Secondary | ICD-10-CM | POA: Diagnosis not present

## 2023-09-30 NOTE — Assessment & Plan Note (Signed)
 Remote history of palpitations with echo and stress test workup in 2015 unremarkable.  Heart monitor at the time showed isolated PVCs. No significant symptoms at least since 2019.  Given asymptomatic nature at this time no further change to her management plan.

## 2023-09-30 NOTE — Patient Instructions (Signed)
 Medication Instructions:  Your physician recommends that you continue on your current medications as directed. Please refer to the Current Medication list given to you today.  *If you need a refill on your cardiac medications before your next appointment, please call your pharmacy*   Lab Work: None ordered If you have labs (blood work) drawn today and your tests are completely normal, you will receive your results only by: MyChart Message (if you have MyChart) OR A paper copy in the mail If you have any lab test that is abnormal or we need to change your treatment, we will call you to review the results.   Testing/Procedures: None ordered   Follow-Up: At [facility], you and your health needs are our priority.  As part of our continuing mission to provide you with exceptional heart care, we have created designated Provider Care Teams.  These Care Teams include your primary Cardiologist (physician) and Advanced Practice Providers (APPs -  Physician Assistants and Nurse Practitioners) who all work together to provide you with the care you need, when you need it.  We recommend signing up for the patient portal called "MyChart".  Sign up information is provided on this After Visit Summary.  MyChart is used to connect with patients for Virtual Visits (Telemedicine).  Patients are able to view lab/test results, encounter notes, upcoming appointments, etc.  Non-urgent messages can be sent to your provider as well.   To learn more about what you can do with MyChart, go to [email or web address].    Your next appointment:   As needed  The format for your next appointment:   In Person  Provider:   Huntley Dec, MD    Other Instructions none  Important Information About Sugar

## 2023-09-30 NOTE — Assessment & Plan Note (Addendum)
 Asymptomatic. Good cardiac functional status. EKG today in the office unremarkable. Functional status meets greater than 4 METS activity.  From cardiac standpoint low risk for perioperative cardiovascular complications. Okay to proceed with her elective right hip surgery under spinal anesthesia has been scheduled for July 28, without any additional cardiac workup required at this time.

## 2023-09-30 NOTE — Progress Notes (Signed)
 Cardiology Consultation:    Date:  09/30/2023   ID:  [name], DOB 12-17-1965, MRN [id number]  PCP:  Claudene Lacks, MD  Cardiologist:  Alean SAUNDERS Amayah Staheli, MD   Referring MD: Loretha Richerd SAUNDERS, MD   Chief Complaint  Patient presents with   Palpitations     ASSESSMENT AND PLAN:   [name] 58 year old pleasant woman here for  preop cardiovascular risk assessment prior to right hip surgery under spinal anesthesia with Dr.Olin at [facility] coming up on July 28.  From cardiology standpoint previously had palpitations and workup at the time with echocardiogram and stress test in 2015 unremarkable, brief interval increase in symptoms in 2019 that improved after adequate hydration.  Has remained asymptomatic from cardiac standpoint without any significant symptoms. EKG in the clinic  Problem List Items Addressed This Visit     Palpitations - Primary   Remote history of palpitations with echo and stress test workup in 2015 unremarkable.  Heart monitor at the time showed isolated PVCs. No significant symptoms at least since 2019.  Given asymptomatic nature at this time no further change to her management plan.      Relevant Orders   EKG 12-Lead (Completed)   Preop cardiovascular exam   Asymptomatic. Good cardiac functional status. EKG today in the office unremarkable. Functional status meets greater than 4 METS activity.  From cardiac standpoint low risk for perioperative cardiovascular complications. Okay to proceed with her elective right hip surgery under spinal anesthesia has been scheduled for July 28, without any additional cardiac workup required at this time.        Return to clinic on an as-needed basis.  History of Present Illness:    [name] is a 58 y.o. female who is being seen today for the evaluation of preop cardiovascular risk assessment prior to right hip surgery under spinal anesthesia with Dr.Olin at [facility] coming up  on July 28 at the request of Rankins, Richerd SAUNDERS, MD.  She was seen by cardiologist back in 2015 for palpitations and workup at that time with echo, exercise stress test were unremarkable and heart monitor noted isolated PVCs.  Pleasant woman here for the visit by herself.  Works at a school. Until last fall she was walking 3 to 5 miles couple times a week without any limitation.  With progressive right hip pain, her mobility has somewhat decreased over the past 6 months.  She has been keeping [facility] doing strength training to help with mobility and has been tolerating it well without any cardiac symptoms.  From cardiac standpoint denies any palpitations. Keeps herself well-hydrated as her symptoms in the past were triggered with her exposure to hot weather and dehydration.  Does not smoke. Drinks alcohol occasionally on the weekends 1 to 2 glasses. No other substance use.  EKG in the clinic today shows sinus rhythm heart rate 83/min, PR interval normal 170 ms, QRS duration 74 ms, QTc normal 439 ms.   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   Macrosomia    Monilial vaginitis 2003   MRSA (methicillin resistant staph aureus) culture positive    Vaginal bleeding, abnormal    Varicella     Past Surgical History:  Procedure Laterality Date   basal cancer cell removal  2000   Face    BREAST BIOPSY Right    BREAST CYST ASPIRATION Left    BREAST EXCISIONAL BIOPSY Left 1996   BREAST SURGERY   1996   CERVICAL FUSION  2009   CESAREAN SECTION     DILATION AND CURETTAGE OF UTERUS  2011   EYE SURGERY  2011   CATARACTS   HERNIA REPAIR  2005   laser surgery on cervical dysplasia  6/89 & 9/92   WISDOM TOOTH EXTRACTION  11/1986    Current Medications: Current Meds  Medication Sig   Cholecalciferol (D3) 25 MCG (1000 UT) capsule Take 1,000 Units by mouth daily.   Multiple Vitamin (MULTIVITAMIN) capsule Take 1 capsule by mouth daily.     sertraline (ZOLOFT) 25 MG tablet Take 25 mg by mouth daily.     Allergies:   Sulfa drugs cross reactors   Social History   Socioeconomic History   Marital status: Married    Spouse name: Not on file   Number of children: 2   Years of education: masters   Highest education level: Not on file  Occupational History   Occupation: [employer] features editor: [employer]  Tobacco Use   Smoking status: Never   Smokeless tobacco: Never  Substance and Sexual Activity   Alcohol use: Yes    Alcohol/week: 3.0 - 4.0 standard drinks of alcohol    Types: 3 - 4 drink(s) per week    Comment: wine/beer on weekends   Drug use: No   Sexual activity: Not Currently    Birth control/protection: Condom, None  Other Topics Concern   Not on file  Social History Narrative   Not on file   Social Drivers of Health   Financial Resource Strain: Not on file  Food Insecurity: Not on file  Transportation Needs: Not on file  Physical Activity: Not on file  Stress: Not on file  Social Connections: Not on file     Family History: The patient's family history includes Alcohol abuse in her maternal grandfather and mother; Breast cancer in her maternal grandmother; Breast cancer (age of onset: 12) in her sister; Breast cancer (age of onset: 45) in her maternal aunt; Breast cancer (age of onset: 42) in her mother; COPD in her mother; Depression in her mother; Heart disease in her paternal grandmother; Hypertension in her father, maternal grandmother, and  paternal grandmother; Kidney disease in her father; Skin cancer in her father. ROS:   Please see the history of present illness.    All 14 point review of systems negative except as described per history of present illness.  EKGs/Labs/Other Studies Reviewed:    The following studies were reviewed today:   EKG:  EKG Interpretation Date/Time:  Monday September 30 2023 10:10:42 EDT Ventricular Rate:  83 PR Interval:  170 QRS Duration:  74 QT Interval:  374 QTC Calculation: 439 R Axis:   86  Text Interpretation: Normal sinus rhythm Low voltage QRS Borderline ECG No previous ECGs available Confirmed by Liborio Hai reddy [phone]) on 09/30/2023 11:03:23 AM    Recent Labs: No results  found for requested labs within last 365 days.  Recent Lipid Panel No results found for: CHOL, TRIG, HDL, CHOLHDL, VLDL, LDLCALC, LDLDIRECT  Physical Exam:    VS:  BP 120/80   Pulse 83   Ht 5' 4 (1.626 m)   Wt 173 lb (78.5 kg)   SpO2 95%   BMI 29.70 kg/m     Wt Readings from Last 3 Encounters:  09/30/23 173 lb (78.5 kg)  03/21/18 165 lb (74.8 kg)  06/12/13 160 lb 11.2 oz (72.9 kg)     GENERAL:  Well nourished, well developed in no acute distress NECK: No JVD; No carotid bruits CARDIAC: RRR, S1 and S2 present, no murmurs, no rubs, no gallops CHEST:  Clear to auscultation without rales, wheezing or rhonchi  Extremities: No pitting pedal edema. Pulses bilaterally symmetric with radial 2+ and dorsalis pedis 2+ NEUROLOGIC:  Alert and oriented x 3  Medication Adjustments/Labs and Tests Ordered: Current medicines are reviewed at length with the patient today.  Concerns regarding medicines are outlined above.  Orders Placed This Encounter  Procedures   EKG 12-Lead   No orders of the defined types were placed in this encounter.   Signed, Alean jess Kobus, MD, MPH, [facility]. 09/30/2023 11:18 AM    [facility]

## 2023-10-07 ENCOUNTER — Ambulatory Visit
Admission: RE | Admit: 2023-10-07 | Discharge: 2023-10-07 | Disposition: A | Discharge status: Home / Self Care | Payer: Self-pay | Source: Ambulatory Visit | Attending: Obstetrics and Gynecology | Admitting: Obstetrics and Gynecology

## 2023-10-07 DIAGNOSIS — Z1231 Encounter for screening mammogram for malignant neoplasm of breast: Secondary | ICD-10-CM
